# Patient Record
Sex: Female | Born: 1995 | Race: Black or African American | Hispanic: No | Marital: Single | Smoking: Never smoker
Health system: Southern US, Community
[De-identification: ages and names within clinical notes are randomized; demographics above are authoritative.]

## PROBLEM LIST (undated history)

## (undated) DIAGNOSIS — J45909 Unspecified asthma, uncomplicated: Secondary | ICD-10-CM

---
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
## 2017-11-03 ENCOUNTER — Emergency Department (HOSPITAL_COMMUNITY)
Admission: EM | Admit: 2017-11-03 | Discharge: 2017-11-04 | Disposition: A | Discharge status: Home / Self Care | Payer: Medicaid Other | Attending: Emergency Medicine | Admitting: Emergency Medicine

## 2017-11-03 ENCOUNTER — Encounter (HOSPITAL_COMMUNITY): Payer: Self-pay | Admitting: Emergency Medicine

## 2017-11-03 ENCOUNTER — Other Ambulatory Visit: Payer: Self-pay

## 2017-11-03 DIAGNOSIS — S61211A Laceration without foreign body of left index finger without damage to nail, initial encounter: Secondary | ICD-10-CM | POA: Insufficient documentation

## 2017-11-03 DIAGNOSIS — W260XXA Contact with knife, initial encounter: Secondary | ICD-10-CM | POA: Diagnosis not present

## 2017-11-03 DIAGNOSIS — Z23 Encounter for immunization: Secondary | ICD-10-CM | POA: Diagnosis not present

## 2017-11-03 DIAGNOSIS — Y93G1 Activity, food preparation and clean up: Secondary | ICD-10-CM | POA: Diagnosis not present

## 2017-11-03 DIAGNOSIS — Y92000 Kitchen of unspecified non-institutional (private) residence as  the place of occurrence of the external cause: Secondary | ICD-10-CM | POA: Insufficient documentation

## 2017-11-03 DIAGNOSIS — S6992XA Unspecified injury of left wrist, hand and finger(s), initial encounter: Secondary | ICD-10-CM | POA: Diagnosis present

## 2017-11-03 DIAGNOSIS — Y998 Other external cause status: Secondary | ICD-10-CM | POA: Insufficient documentation

## 2017-11-03 DIAGNOSIS — J45909 Unspecified asthma, uncomplicated: Secondary | ICD-10-CM | POA: Insufficient documentation

## 2017-11-03 LAB — POC URINE PREG, ED: Preg Test, Ur: NEGATIVE

## 2017-11-03 MED ORDER — IBUPROFEN 400 MG PO TABS
600.0000 mg | ORAL_TABLET | Freq: Once | ORAL | Status: AC
  Administered 2017-11-03: 600 mg via ORAL
  Filled 2017-11-03: qty 1

## 2017-11-03 MED ORDER — BUPIVACAINE HCL 0.25 % IJ SOLN
10.0000 mL | Freq: Once | INTRAMUSCULAR | Status: DC
  Filled 2017-11-03: qty 10

## 2017-11-03 MED ORDER — BUPIVACAINE HCL (PF) 0.25 % IJ SOLN
10.0000 mL | Freq: Once | INTRAMUSCULAR | Status: AC
  Administered 2017-11-03: 10 mL
  Filled 2017-11-03: qty 30

## 2017-11-03 MED ORDER — TETANUS-DIPHTH-ACELL PERTUSSIS 5-2.5-18.5 LF-MCG/0.5 IM SUSP
0.5000 mL | Freq: Once | INTRAMUSCULAR | Status: AC
  Administered 2017-11-03: 0.5 mL via INTRAMUSCULAR
  Filled 2017-11-03: qty 0.5

## 2017-11-03 NOTE — ED Triage Notes (Signed)
Pt states that she got a small laceration on her index finger left hand while cutting some sausage. Pt has a band aid applied by her self at home bleeding controlled. Pt will like to know if she is pregnant no having a period for 3 months.

## 2017-11-03 NOTE — ED Provider Notes (Signed)
[facility] EM[facility]ENC[facility] Provider Note   CSN: [id number] Arrival date & time: 11/03/17  2017     History   Chief Complaint Chief Complaint  Patient presents with  . Finger Injury    HPI Annette Lynch is a 21 y.o. female.  HPI   Patient is a 21 year old female with a history of asthma presenting for a laceration to the index finger of the left hand prior to arrival.  Patient reports that she was preparing sausages and reaching into a knife block when she cut the extensor surface of the left first finger.  Patient reports that she has decreased sensation but no loss of sensation.  Bandage immediately applied.  Tetanus not up-to-date.  Past Medical History:  Diagnosis Date  . Asthma     There are no active problems to display for this patient.   History reviewed. No pertinent surgical history.  OB History    No data available       Home Medications    Prior to Admission medications   Medication Sig Start Date End Date Taking? Authorizing Provider  cephALEXin (KEFLEX) 500 MG capsule Take 1 capsule (500 mg total) by mouth 4 (four) times daily. 11/04/17   Elisha PonderMurray, Alyssa B, PA-C    Family History No family history on file.  Social History Social History   Tobacco Use  . Smoking status: Never Smoker  Substance Use Topics  . Alcohol use: No    Frequency: Never  . Drug use: No     Allergies   Patient has no known allergies.   Review of Systems Review of Systems  Skin: Positive for wound.  Neurological: Negative for weakness and numbness.     Physical Exam Updated Vital Signs BP (!) 145/56 (BP Location: Right Arm)   Pulse 84   Temp 98.3 F (36.8 C) (Oral)   Resp 16   Ht 5\' 8"  (1.727 m)   Wt 106.6 kg (235 lb)   LMP 08/22/2017   SpO2 100%   BMI 35.73 kg/m   Physical Exam  Constitutional: She appears well-developed and well-nourished. No distress.  Sitting comfortably in bed.  HENT:  Head: Normocephalic and  atraumatic.  Eyes: Conjunctivae are normal. Right eye exhibits no discharge. Left eye exhibits no discharge.  EOMs normal to gross examination.  Neck: Normal range of motion.  Cardiovascular: Normal rate and regular rhythm.  Intact, 2+ radial and ulnar pulse of the left hand.  Pulmonary/Chest:  Normal respiratory effort. Patient converses comfortably. No audible wheeze or stridor.  Abdominal: She exhibits no distension.  Musculoskeletal: Normal range of motion.  Left Hand Exam: Hand Exam:  Inspection: There is approximately a 1-1.5 cm laceration extending to the dermis on the extensor surface of the left index finger.   Palpation: No tenderness to palpation of MCP, PIP, or DIP of the affected left index finger. ROM: Passive/active ROM intact at wrist, MCP, PIP, and DIP joints, thumb MCP and IP joints, and no rotational deformity of metacarpals noted. Ligamentous stability: No laxity to valgus/varus stress of MCP, PIP, or DIP joints. No joint laxity with radial stress of thumb. Flexor/Extensor tendons: FDS/FDP tendons intact in digits 2-5 at PIP/DIP joints, respectively; extensor tendons intact in all digits Nerve testing:  Sensation is intact to the distal tip and surrounding laceration of the left index finger.  Neurological: She is alert.  Cranial nerves intact to gross observation. Patient moves extremities without difficulty.  Skin: Skin is warm and dry. She is  not diaphoretic.  Psychiatric: She has a normal mood and affect. Her behavior is normal. Judgment and thought content normal.  Nursing note and vitals reviewed.    ED Treatments / Results  Labs (all labs ordered are listed, but only abnormal results are displayed) Labs Reviewed  POC URINE PREG, ED    EKG  EKG Interpretation None       Radiology No results found.  Procedures .Nerve Block Date/Time: 11/04/2017 3:45 AM Performed by: Elisha Ponder, PA-C Authorized by: Elisha Ponder, PA-C   Consent:     Consent obtained:  Verbal   Consent given by:  Patient   Risks discussed:  Unsuccessful block Indications:    Indications:  Procedural anesthesia Location:    Body area:  Upper extremity (Left second digit)   Laterality:  Left Pre-procedure details:    Skin preparation:  2% chlorhexidine Skin anesthesia (see MAR for exact dosages):    Skin anesthesia method:  Local infiltration   Local anesthetic:  Bupivacaine 0.25% w/o epi Procedure details (see MAR for exact dosages):    Block needle gauge:  25 G   Anesthetic injected:  Bupivacaine 0.25% w/o epi   Injection procedure:  Anatomic landmarks identified and incremental injection   Paresthesia:  None Post-procedure details:    Outcome:  Anesthesia achieved   Patient tolerance of procedure:  Tolerated well, no immediate complications .Marland KitchenLaceration Repair Date/Time: 11/04/2017 3:47 AM Performed by: Elisha Ponder, PA-C Authorized by: Elisha Ponder, PA-C   Consent:    Consent obtained:  Verbal   Consent given by:  Patient   Risks discussed:  Infection and pain Anesthesia (see MAR for exact dosages):    Anesthesia method:  Local infiltration   Local anesthetic:  Bupivacaine 0.25% w/o epi Laceration details:    Location:  Finger   Finger location:  L index finger   Length (cm):  1.5 Repair type:    Repair type:  Simple Pre-procedure details:    Preparation:  Patient was prepped and draped in usual sterile fashion Exploration:    Hemostasis achieved with:  Direct pressure   Wound exploration: wound explored through full range of motion     Contaminated: no   Treatment:    Area cleansed with:  Betadine   Amount of cleaning:  Standard   Irrigation solution:  Sterile saline   Irrigation volume:  1000 ml   Irrigation method:  Syringe Skin repair:    Repair method:  Sutures   Suture size:  4-0   Suture material:  Prolene   Suture technique:  Simple interrupted   Number of sutures:  3 Approximation:    Approximation:   Close Post-procedure details:    Dressing:  Non-adherent dressing and splint for protection   Patient tolerance of procedure:  Tolerated well, no immediate complications   (including critical care time)  Medications Ordered in ED Medications  Tdap (BOOSTRIX) injection 0.5 mL (0.5 mLs Intramuscular Given 11/03/17 2235)  ibuprofen (ADVIL,MOTRIN) tablet 600 mg (600 mg Oral Given 11/03/17 2235)  bupivacaine (PF) (MARCAINE) 0.25 % injection 10 mL (10 mLs Infiltration Given 11/03/17 2314)     Initial Impression / Assessment and Plan / ED Course  I have reviewed the triage vital signs and the nursing notes.  Pertinent labs & imaging results that were available during my care of the patient were reviewed by me and considered in my medical decision making (see chart for details).     Final Clinical Impressions(s) / ED Diagnoses  Final diagnoses:  Laceration of left index finger without foreign body without damage to nail, initial encounter   Patient is well-appearing and in no acute distress.  Mechanism of the laceration with a knife is not concerning for foreign body warranting imaging at this time.  Patient is neurovascularly intact in the left index finger with no evidence of tendon compromise.  Primary closure is warranted.  3 Prolene sutures placed today.  Patient tolerated procedure well.  I discussed post procedure care with patient and instructed to follow-up in 7 days for suture removal.  Due to that this injury occurred with raw meat exposure from knife, will treat with antibiotic prophylaxis.  Return precautions given for any spreading erythema, drainage, worsening pain or swelling.  Patient is in understanding and agrees with the plan of care.   Tetanus updated today. ED Discharge Orders        Ordered    cephALEXin (KEFLEX) 500 MG capsule  4 times daily     11/04/17 0109       Delia ChimesMurray, Alyssa B, PA-C 11/04/17 0351    Benjiman CorePickering, Nathan, MD 11/06/17 0028

## 2017-11-04 MED ORDER — CEPHALEXIN 500 MG PO CAPS: 500.0000 mg | ORAL_CAPSULE | Freq: Four times a day (QID) | ORAL | 0 refills | Status: DC

## 2017-11-04 NOTE — Discharge Instructions (Signed)
Please see the information and instructions below regarding your visit.  Your diagnoses today include:  1. Laceration of left index finger without foreign body without damage to nail, initial encounter      Tests performed today include: Vital signs. See below for your results today.   Medications prescribed:   Take any prescribed medications only as directed.  Ibuprofen alternating with Tylenol for pain.   Please take all of your antibiotics until finished.   You may develop abdominal discomfort or nausea from the antibiotic. If this occurs, you may take it with food. Some patients also get diarrhea with antibiotics. You may help offset this with probiotics which you can buy or get in yogurt. Do not eat or take the probiotics until 2 hours after your antibiotic. Some women develop vaginal yeast infections after antibiotics. If you develop unusual vaginal discharge after being on this medication, please see your primary care provider.   Some people develop allergies to antibiotics. Symptoms of antibiotic allergy can be mild and include a flat rash and itching. They can also be more serious and include:  ?Hives - Hives are raised, red patches of skin that are usually very itchy.  ?Lip or tongue swelling  ?Trouble swallowing or breathing  ?Blistering of the skin or mouth.  If you have any of these serious symptoms, please seek emergency medical care immediately.   Home care instructions:  Follow any educational materials and wound care instructions contained in this packet.   Keep affected area above the level of your heart when possible to minimize swelling. Wash area gently twice a day with warm soapy water. Do not apply alcohol or hydrogen peroxide directly over a wound. Cover the area if it is draining or weeping. Keep the bandage in place for 24 hours and refrain from getting the wound wet for 24 hours. After that, you may get the area wet, but please ensure that you dry it  completely afterwards.  Please refrain from soaking sutures for long periods of time, or swimming in chlorinated water   You may apply antibiotic ointment such as Bacitracin or Neosporin.  Follow-up instructions: Suture Removal: Return to the Emergency Department or see your primary care care doctor in 7 days for a recheck of your wound and removal of your sutures or staples.    Return instructions:  Return to the Emergency Department if you have: Fever Worsening pain Worsening swelling of the wound Pus draining from the wound Redness of the skin that moves away from the wound, especially if it streaks away from the affected area  Any other emergent concerns  Your vital signs today were: BP (!) 150/66 (BP Location: Right Arm)    Pulse 97    Temp 98.3 F (36.8 C) (Oral)    Resp 18    Ht 5\' 8"  (1.727 m)    Wt 106.6 kg (235 lb)    LMP 08/22/2017    SpO2 100%    BMI 35.73 kg/m  If your blood pressure (BP) was elevated on multiple readings during this visit above 130 for the top number or above 80 for the bottom number, please have this repeated by your primary care provider within one month.   In regards to not having a period, please follow-up with your primary doctor about the workup of this.  I listed some primary doctors below that you may call to establish care.  To find a primary care or specialty doctor please call [phone] or [phone] to  access "[facility] Find a Doctor Service."  You may also go on the [facility] website at [email or web address]  There are also multiple [facility] practices throughout the [place] that are frequently accepting new patients. You may find a clinic that is close to your home and contact them.  [facility] - [address] [phone]  [facility] in [place] (also locations in [place] and [place]) - [address] [place]  [phone]  [facility] - [address] [phone]   --------------  Thank you for allowing us to participate in your care today! It was a pleasure taking care of you.

## 2017-11-21 ENCOUNTER — Ambulatory Visit (HOSPITAL_COMMUNITY)
Admission: EM | Admit: 2017-11-21 | Discharge: 2017-11-21 | Disposition: A | Discharge status: Home / Self Care | Payer: Medicaid Other

## 2017-11-21 DIAGNOSIS — Z4802 Encounter for removal of sutures: Secondary | ICD-10-CM

## 2017-11-21 NOTE — ED Triage Notes (Signed)
Pt here for suture removal. Has three sutures in her L pointer finger. Laceration is well healed and approximated. No signs of infection. Three sutures removed. Area is clean and dry. Denies pain.  Pt given return instructions, verbalized understanding. Ambulated outside of UC.

## 2018-01-17 ENCOUNTER — Ambulatory Visit: Payer: Medicaid Other | Admitting: Student in an Organized Health Care Education/Training Program

## 2023-09-25 ENCOUNTER — Inpatient Hospital Stay (HOSPITAL_COMMUNITY)
Admission: EM | Admit: 2023-09-25 | Discharge: 2023-09-28 | DRG: 638 | Disposition: A | Discharge status: Home Health | Payer: Medicaid Other | Attending: Internal Medicine | Admitting: Internal Medicine

## 2023-09-25 ENCOUNTER — Encounter (HOSPITAL_COMMUNITY): Payer: Self-pay | Admitting: Emergency Medicine

## 2023-09-25 ENCOUNTER — Other Ambulatory Visit: Payer: Self-pay

## 2023-09-25 DIAGNOSIS — Z888 Allergy status to other drugs, medicaments and biological substances status: Secondary | ICD-10-CM

## 2023-09-25 DIAGNOSIS — E66813 Obesity, class 3: Secondary | ICD-10-CM | POA: Insufficient documentation

## 2023-09-25 DIAGNOSIS — R739 Hyperglycemia, unspecified: Secondary | ICD-10-CM | POA: Diagnosis present

## 2023-09-25 DIAGNOSIS — E1101 Type 2 diabetes mellitus with hyperosmolarity with coma: Principal | ICD-10-CM | POA: Diagnosis present

## 2023-09-25 DIAGNOSIS — E282 Polycystic ovarian syndrome: Secondary | ICD-10-CM

## 2023-09-25 DIAGNOSIS — Z7984 Long term (current) use of oral hypoglycemic drugs: Secondary | ICD-10-CM

## 2023-09-25 DIAGNOSIS — E1110 Type 2 diabetes mellitus with ketoacidosis without coma: Secondary | ICD-10-CM

## 2023-09-25 DIAGNOSIS — E1100 Type 2 diabetes mellitus with hyperosmolarity without nonketotic hyperglycemic-hyperosmolar coma (NKHHC): Secondary | ICD-10-CM | POA: Diagnosis present

## 2023-09-25 DIAGNOSIS — Z6841 Body Mass Index (BMI) 40.0 and over, adult: Secondary | ICD-10-CM

## 2023-09-25 DIAGNOSIS — E1111 Type 2 diabetes mellitus with ketoacidosis with coma: Secondary | ICD-10-CM | POA: Diagnosis present

## 2023-09-25 DIAGNOSIS — E1310 Other specified diabetes mellitus with ketoacidosis without coma: Principal | ICD-10-CM

## 2023-09-25 LAB — URINALYSIS, ROUTINE W REFLEX MICROSCOPIC
Bacteria, UA: NONE SEEN
Bilirubin Urine: NEGATIVE
Glucose, UA: >=500 mg/dL — AB
Hgb urine dipstick: NEGATIVE
Ketones, ur: 20 mg/dL — AB
Leukocytes,Ua: NEGATIVE
Nitrite: NEGATIVE
Protein, ur: NEGATIVE mg/dL
Specific Gravity, Urine: 1.032 — ABNORMAL HIGH (ref 1.005–1.030)
pH: 5 (ref 5.0–8.0)

## 2023-09-25 LAB — CBC WITH DIFFERENTIAL/PLATELET
Abs Immature Granulocytes: 0.02 10*3/uL (ref 0.00–0.07)
Basophils Absolute: 0 10*3/uL (ref 0.0–0.1)
Basophils Relative: 0 %
Eosinophils Absolute: 0.1 10*3/uL (ref 0.0–0.5)
Eosinophils Relative: 1 %
HCT: 47.2 % — ABNORMAL HIGH (ref 36.0–46.0)
Hemoglobin: 14.3 g/dL (ref 12.0–15.0)
Immature Granulocytes: 0 %
Lymphocytes Relative: 28 %
Lymphs Abs: 2 10*3/uL (ref 0.7–4.0)
MCH: 23.7 pg — ABNORMAL LOW (ref 26.0–34.0)
MCHC: 30.3 g/dL (ref 30.0–36.0)
MCV: 78.1 fL — ABNORMAL LOW (ref 80.0–100.0)
Monocytes Absolute: 0.6 10*3/uL (ref 0.1–1.0)
Monocytes Relative: 8 %
Neutro Abs: 4.6 10*3/uL (ref 1.7–7.7)
Neutrophils Relative %: 63 %
Platelets: 320 10*3/uL (ref 150–400)
RBC: 6.04 MIL/uL — ABNORMAL HIGH (ref 3.87–5.11)
RDW: 14.6 % (ref 11.5–15.5)
WBC: 7.3 10*3/uL (ref 4.0–10.5)
nRBC: 0 % (ref 0.0–0.2)

## 2023-09-25 LAB — BETA-HYDROXYBUTYRIC ACID: Beta-Hydroxybutyric Acid: 2.99 mmol/L — ABNORMAL HIGH (ref 0.05–0.27)

## 2023-09-25 LAB — BASIC METABOLIC PANEL
Anion gap: 15 (ref 5–15)
BUN: 14 mg/dL (ref 6–20)
CO2: 21 mmol/L — ABNORMAL LOW (ref 22–32)
Calcium: 9.4 mg/dL (ref 8.9–10.3)
Chloride: 93 mmol/L — ABNORMAL LOW (ref 98–111)
Creatinine, Ser: 0.94 mg/dL (ref 0.44–1.00)
GFR, Estimated: >60 mL/min (ref >60)
Glucose, Bld: 566 mg/dL (ref 70–99)
Potassium: 4.2 mmol/L (ref 3.5–5.1)
Sodium: 129 mmol/L — ABNORMAL LOW (ref 135–145)

## 2023-09-25 LAB — HCG, SERUM, QUALITATIVE: Preg, Serum: NEGATIVE

## 2023-09-25 LAB — CBG MONITORING, ED: Glucose-Capillary: 569 mg/dL (ref 70–99)

## 2023-09-25 NOTE — ED Triage Notes (Signed)
Pt presents for CBG >600 at home along with excessive urination, N/V,  and thirst. Takes metformin typically.

## 2023-09-25 NOTE — ED Provider Triage Note (Signed)
Emergency Medicine Provider Triage Evaluation Note  Annette Lynch , a 27 y.o. female  was evaluated in triage.  Pt complains of hyperglycemia,Oncology metformin. Increased thirst, dry mouth. Blood sugar read "high". No hx of same.  Patient denies sxs of infection  Review of Systems  Positive: Dry mouth Negative: fever  Physical Exam  BP (!) 152/94 (BP Location: Right Arm)   Pulse (!) 105   Temp 97.8 F (36.6 C) (Oral)   Resp (!) 22   Wt 124.7 kg   SpO2 94%   BMI 41.81 kg/m  Gen:   Awake, no distress   Resp:  Normal effort  MSK:   Moves extremities without difficulty  Other:  No kussmaul  Medical Decision Making  Medically screening exam initiated at 9:24 PM.  Appropriate orders placed.  Annette Lynch was informed that the remainder of the evaluation will be completed by another provider, this initial triage assessment does not replace that evaluation, and the importance of remaining in the ED until their evaluation is complete.     Arthor Captain, PA-C 09/25/23 2126

## 2023-09-26 ENCOUNTER — Other Ambulatory Visit (HOSPITAL_COMMUNITY): Payer: Self-pay

## 2023-09-26 ENCOUNTER — Telehealth (HOSPITAL_COMMUNITY): Payer: Self-pay | Admitting: Pharmacy Technician

## 2023-09-26 DIAGNOSIS — E1111 Type 2 diabetes mellitus with ketoacidosis with coma: Secondary | ICD-10-CM | POA: Diagnosis present

## 2023-09-26 DIAGNOSIS — Z794 Long term (current) use of insulin: Secondary | ICD-10-CM

## 2023-09-26 DIAGNOSIS — E1100 Type 2 diabetes mellitus with hyperosmolarity without nonketotic hyperglycemic-hyperosmolar coma (NKHHC): Secondary | ICD-10-CM | POA: Diagnosis present

## 2023-09-26 DIAGNOSIS — Z6841 Body Mass Index (BMI) 40.0 and over, adult: Secondary | ICD-10-CM | POA: Diagnosis not present

## 2023-09-26 DIAGNOSIS — E1165 Type 2 diabetes mellitus with hyperglycemia: Secondary | ICD-10-CM | POA: Diagnosis not present

## 2023-09-26 DIAGNOSIS — Z888 Allergy status to other drugs, medicaments and biological substances status: Secondary | ICD-10-CM | POA: Diagnosis not present

## 2023-09-26 DIAGNOSIS — Z7984 Long term (current) use of oral hypoglycemic drugs: Secondary | ICD-10-CM | POA: Diagnosis not present

## 2023-09-26 DIAGNOSIS — E1101 Type 2 diabetes mellitus with hyperosmolarity with coma: Secondary | ICD-10-CM | POA: Diagnosis present

## 2023-09-26 DIAGNOSIS — E1310 Other specified diabetes mellitus with ketoacidosis without coma: Secondary | ICD-10-CM | POA: Diagnosis present

## 2023-09-26 DIAGNOSIS — E282 Polycystic ovarian syndrome: Secondary | ICD-10-CM | POA: Diagnosis present

## 2023-09-26 DIAGNOSIS — E1110 Type 2 diabetes mellitus with ketoacidosis without coma: Secondary | ICD-10-CM

## 2023-09-26 DIAGNOSIS — R739 Hyperglycemia, unspecified: Secondary | ICD-10-CM | POA: Diagnosis present

## 2023-09-26 LAB — GLUCOSE, CAPILLARY
Glucose-Capillary: 212 mg/dL — ABNORMAL HIGH (ref 70–99)
Glucose-Capillary: 241 mg/dL — ABNORMAL HIGH (ref 70–99)
Glucose-Capillary: 417 mg/dL — ABNORMAL HIGH (ref 70–99)
Glucose-Capillary: 336 mg/dL — ABNORMAL HIGH (ref 70–99)
Glucose-Capillary: 357 mg/dL — ABNORMAL HIGH (ref 70–99)
Glucose-Capillary: 326 mg/dL — ABNORMAL HIGH (ref 70–99)
Glucose-Capillary: 462 mg/dL — ABNORMAL HIGH (ref 70–99)

## 2023-09-26 LAB — BLOOD GAS, VENOUS
Acid-base deficit: 2.5 mmol/L — ABNORMAL HIGH (ref 0.0–2.0)
Bicarbonate: 21.7 mmol/L (ref 20.0–28.0)
O2 Saturation: 97.4 %
Patient temperature: 36.1
pCO2, Ven: 34 mm[Hg] — ABNORMAL LOW (ref 44–60)
pH, Ven: 7.41 (ref 7.25–7.43)
pO2, Ven: 69 mm[Hg] — ABNORMAL HIGH (ref 32–45)

## 2023-09-26 LAB — BASIC METABOLIC PANEL WITH GFR
Anion gap: 9 (ref 5–15)
BUN: 11 mg/dL (ref 6–20)
CO2: 23 mmol/L (ref 22–32)
Calcium: 9 mg/dL (ref 8.9–10.3)
Chloride: 102 mmol/L (ref 98–111)
Creatinine, Ser: 0.59 mg/dL (ref 0.44–1.00)
GFR, Estimated: >60 mL/min
Glucose, Bld: 281 mg/dL — ABNORMAL HIGH (ref 70–99)
Potassium: 3.2 mmol/L — ABNORMAL LOW (ref 3.5–5.1)
Sodium: 134 mmol/L — ABNORMAL LOW (ref 135–145)

## 2023-09-26 LAB — CBG MONITORING, ED
Glucose-Capillary: 261 mg/dL — ABNORMAL HIGH (ref 70–99)
Glucose-Capillary: 286 mg/dL — ABNORMAL HIGH (ref 70–99)
Glucose-Capillary: 347 mg/dL — ABNORMAL HIGH (ref 70–99)
Glucose-Capillary: 505 mg/dL (ref 70–99)

## 2023-09-26 LAB — CBC WITH DIFFERENTIAL/PLATELET
Abs Immature Granulocytes: 0.03 10*3/uL (ref 0.00–0.07)
Basophils Absolute: 0 10*3/uL (ref 0.0–0.1)
Basophils Relative: 0 %
Eosinophils Absolute: 0.1 10*3/uL (ref 0.0–0.5)
Eosinophils Relative: 1 %
HCT: 42.7 % (ref 36.0–46.0)
Hemoglobin: 13.2 g/dL (ref 12.0–15.0)
Immature Granulocytes: 0 %
Lymphocytes Relative: 36 %
Lymphs Abs: 2.6 10*3/uL (ref 0.7–4.0)
MCH: 24.1 pg — ABNORMAL LOW (ref 26.0–34.0)
MCHC: 30.9 g/dL (ref 30.0–36.0)
MCV: 77.9 fL — ABNORMAL LOW (ref 80.0–100.0)
Monocytes Absolute: 0.6 10*3/uL (ref 0.1–1.0)
Monocytes Relative: 8 %
Neutro Abs: 3.9 10*3/uL (ref 1.7–7.7)
Neutrophils Relative %: 55 %
Platelets: 297 10*3/uL (ref 150–400)
RBC: 5.48 MIL/uL — ABNORMAL HIGH (ref 3.87–5.11)
RDW: 14.2 % (ref 11.5–15.5)
WBC: 7.2 10*3/uL (ref 4.0–10.5)
nRBC: 0 % (ref 0.0–0.2)

## 2023-09-26 LAB — HEMOGLOBIN A1C
Hgb A1c MFr Bld: 10.1 % — ABNORMAL HIGH (ref 4.8–5.6)
Mean Plasma Glucose: 243.17 mg/dL

## 2023-09-26 LAB — MRSA NEXT GEN BY PCR, NASAL: MRSA by PCR Next Gen: DETECTED — AB

## 2023-09-26 MED ORDER — DEXTROSE 50 % IV SOLN: 0.0000 mL | INTRAVENOUS | Status: DC | PRN

## 2023-09-26 MED ORDER — INSULIN ASPART 100 UNIT/ML IJ SOLN
5.0000 [IU] | Freq: Three times a day (TID) | INTRAMUSCULAR | Status: DC
  Administered 2023-09-26: 5 [IU] via SUBCUTANEOUS

## 2023-09-26 MED ORDER — INSULIN ASPART 100 UNIT/ML IJ SOLN
0.0000 [IU] | Freq: Three times a day (TID) | INTRAMUSCULAR | Status: DC
  Administered 2023-09-26: 15 [IU] via SUBCUTANEOUS
  Administered 2023-09-26: 5 [IU] via SUBCUTANEOUS
  Administered 2023-09-26: 11 [IU] via SUBCUTANEOUS
  Administered 2023-09-27: 15 [IU] via SUBCUTANEOUS
  Filled 2023-09-26: qty 0.15

## 2023-09-26 MED ORDER — INSULIN GLARGINE-YFGN 100 UNIT/ML ~~LOC~~ SOLN
25.0000 [IU] | Freq: Every day | SUBCUTANEOUS | Status: DC
  Administered 2023-09-27: 25 [IU] via SUBCUTANEOUS
  Filled 2023-09-26: qty 0.25

## 2023-09-26 MED ORDER — INSULIN ASPART 100 UNIT/ML IJ SOLN
0.0000 [IU] | Freq: Every day | INTRAMUSCULAR | Status: DC
  Filled 2023-09-26: qty 0.05

## 2023-09-26 MED ORDER — ONDANSETRON HCL 4 MG PO TABS: 4.0000 mg | ORAL_TABLET | Freq: Four times a day (QID) | ORAL | Status: DC | PRN

## 2023-09-26 MED ORDER — INSULIN ASPART 100 UNIT/ML IJ SOLN
11.0000 [IU] | Freq: Once | INTRAMUSCULAR | Status: AC
  Administered 2023-09-26: 11 [IU] via SUBCUTANEOUS

## 2023-09-26 MED ORDER — ACETAMINOPHEN 325 MG PO TABS: 650.0000 mg | ORAL_TABLET | Freq: Four times a day (QID) | ORAL | Status: DC | PRN

## 2023-09-26 MED ORDER — ENOXAPARIN SODIUM 40 MG/0.4ML IJ SOSY
40.0000 mg | PREFILLED_SYRINGE | INTRAMUSCULAR | Status: DC
Start: 2023-09-26 — End: 2023-09-28
  Administered 2023-09-26 – 2023-09-27 (×2): 40 mg via SUBCUTANEOUS
  Filled 2023-09-26 (×3): qty 0.4

## 2023-09-26 MED ORDER — INSULIN ASPART 100 UNIT/ML IJ SOLN
6.0000 [IU] | Freq: Three times a day (TID) | INTRAMUSCULAR | Status: DC
  Administered 2023-09-27: 6 [IU] via SUBCUTANEOUS

## 2023-09-26 MED ORDER — INSULIN ASPART 100 UNIT/ML IJ SOLN
7.0000 [IU] | Freq: Once | INTRAMUSCULAR | Status: AC
  Administered 2023-09-26: 7 [IU] via SUBCUTANEOUS

## 2023-09-26 MED ORDER — POTASSIUM CHLORIDE CRYS ER 20 MEQ PO TBCR
60.0000 meq | EXTENDED_RELEASE_TABLET | Freq: Once | ORAL | Status: AC
  Administered 2023-09-26: 60 meq via ORAL
  Filled 2023-09-26: qty 3

## 2023-09-26 MED ORDER — DEXTROSE-SODIUM CHLORIDE 5-0.45 % IV SOLN: INTRAVENOUS | Status: AC

## 2023-09-26 MED ORDER — SENNOSIDES-DOCUSATE SODIUM 8.6-50 MG PO TABS: 1.0000 | ORAL_TABLET | Freq: Every evening | ORAL | Status: DC | PRN

## 2023-09-26 MED ORDER — INSULIN REGULAR(HUMAN) IN NACL 100-0.9 UT/100ML-% IV SOLN
INTRAVENOUS | Status: DC
  Administered 2023-09-26: 13 [IU]/h via INTRAVENOUS
  Filled 2023-09-26: qty 100

## 2023-09-26 MED ORDER — ONDANSETRON HCL 4 MG/2ML IJ SOLN: 4.0000 mg | Freq: Four times a day (QID) | INTRAMUSCULAR | Status: DC | PRN

## 2023-09-26 MED ORDER — INSULIN GLARGINE-YFGN 100 UNIT/ML ~~LOC~~ SOLN
18.0000 [IU] | SUBCUTANEOUS | Status: DC
  Administered 2023-09-26: 18 [IU] via SUBCUTANEOUS
  Filled 2023-09-26 (×2): qty 0.18

## 2023-09-26 MED ORDER — LIVING WELL WITH DIABETES BOOK
Freq: Once | Status: AC
  Filled 2023-09-26: qty 1

## 2023-09-26 MED ORDER — LACTATED RINGERS IV BOLUS
20.0000 mL/kg | Freq: Once | INTRAVENOUS | Status: AC
  Administered 2023-09-26: 2494 mL via INTRAVENOUS

## 2023-09-26 MED ORDER — CHLORHEXIDINE GLUCONATE CLOTH 2 % EX PADS
6.0000 | MEDICATED_PAD | Freq: Every day | CUTANEOUS | Status: DC
  Administered 2023-09-27: 6 via TOPICAL

## 2023-09-26 MED ORDER — INSULIN GLARGINE-YFGN 100 UNIT/ML ~~LOC~~ SOLN
7.0000 [IU] | SUBCUTANEOUS | Status: AC
  Administered 2023-09-26: 7 [IU] via SUBCUTANEOUS
  Filled 2023-09-26: qty 0.07

## 2023-09-26 MED ORDER — ACETAMINOPHEN 650 MG RE SUPP: 650.0000 mg | Freq: Four times a day (QID) | RECTAL | Status: DC | PRN

## 2023-09-26 NOTE — ED Provider Notes (Addendum)
[place] EMERGENCY DEPARTMENT AT [facility] Provider Note   CSN: [id number] Arrival date & time: 09/25/23  2041     History  Chief Complaint  Patient presents with   Hyperglycemia    Annette Lynch is a 27 y.o. female.  The history is provided by the patient and a relative.  Hyperglycemia Severity:  Severe Onset quality:  Gradual Duration: days. Timing:  Constant Progression:  Worsening Diabetes status:  Controlled with oral medications Current diabetic therapy:  Metformin but has missed doses, does not have a glucometer and has never seen a diabetic nutritionist Context: not change in medication   Relieved by:  Nothing Ineffective treatments:  None tried Associated symptoms: increased thirst and polyuria   Associated symptoms: no fever, no vomiting and no weakness   Risk factors: no pancreatic disease       Past Medical History:  Diagnosis Date   Asthma      Home Medications Prior to Admission medications   Medication Sig Start Date End Date Taking? Authorizing Provider  cephALEXin (KEFLEX) 500 MG capsule Take 1 capsule (500 mg total) by mouth 4 (four) times daily. 11/04/17   Aviva Kluver B, PA-C      Allergies    Diphenhydramine and Motrin [ibuprofen]    Review of Systems   Review of Systems  Constitutional:  Negative for fever.  HENT:  Negative for facial swelling.   Eyes:  Negative for redness.  Respiratory:  Negative for wheezing and stridor.   Gastrointestinal:  Negative for vomiting.  Endocrine: Positive for polydipsia and polyuria.  Neurological:  Negative for weakness.  All other systems reviewed and are negative.   Physical Exam Updated Vital Signs BP (!) 144/93 (BP Location: Left Arm)   Pulse (!) 109   Temp 98 F (36.7 C) (Oral)   Resp 18   Wt 124.7 kg   SpO2 98%   BMI 41.81 kg/m  Physical Exam Vitals and nursing note reviewed.  Constitutional:      General: She is not in acute distress.    Appearance: Normal  appearance. She is well-developed.  HENT:     Head: Normocephalic and atraumatic.     Nose: Nose normal.  Eyes:     Pupils: Pupils are equal, round, and reactive to light.  Cardiovascular:     Rate and Rhythm: Normal rate and regular rhythm.     Pulses: Normal pulses.     Heart sounds: Normal heart sounds.  Pulmonary:     Effort: Pulmonary effort is normal. No respiratory distress.     Breath sounds: Normal breath sounds.  Abdominal:     General: Bowel sounds are normal. There is no distension.     Palpations: Abdomen is soft.     Tenderness: There is no abdominal tenderness. There is no guarding or rebound.  Musculoskeletal:        General: Normal range of motion.     Cervical back: Neck supple.  Skin:    General: Skin is warm and dry.     Capillary Refill: Capillary refill takes less than 2 seconds.     Findings: No erythema or rash.  Neurological:     General: No focal deficit present.     Mental Status: She is alert and oriented to person, place, and time.     Deep Tendon Reflexes: Reflexes normal.  Psychiatric:        Mood and Affect: Mood normal.     ED Results / Procedures / Treatments  Labs (all labs ordered are listed, but only abnormal results are displayed) Results for orders placed or performed during the hospital encounter of 09/25/23  hCG, serum, qualitative  Result Value Ref Range   Preg, Serum NEGATIVE NEGATIVE  Urinalysis, Routine w reflex microscopic -Urine, Clean Catch  Result Value Ref Range   Color, Urine COLORLESS (A) YELLOW   APPearance CLEAR CLEAR   Specific Gravity, Urine 1.032 (H) 1.005 - 1.030   pH 5.0 5.0 - 8.0   Glucose, UA >=500 (A) NEGATIVE mg/dL   Hgb urine dipstick NEGATIVE NEGATIVE   Bilirubin Urine NEGATIVE NEGATIVE   Ketones, ur 20 (A) NEGATIVE mg/dL   Protein, ur NEGATIVE NEGATIVE mg/dL   Nitrite NEGATIVE NEGATIVE   Leukocytes,Ua NEGATIVE NEGATIVE   RBC / HPF 0-5 0 - 5 RBC/hpf   WBC, UA 0-5 0 - 5 WBC/hpf   Bacteria, UA NONE  SEEN NONE SEEN   Squamous Epithelial / HPF 0-5 0 - 5 /HPF  Basic metabolic panel  Result Value Ref Range   Sodium 129 (L) 135 - 145 mmol/L   Potassium 4.2 3.5 - 5.1 mmol/L   Chloride 93 (L) 98 - 111 mmol/L   CO2 21 (L) 22 - 32 mmol/L   Glucose, Bld 566 (HH) 70 - 99 mg/dL   BUN 14 6 - 20 mg/dL   Creatinine, Ser 7.82 0.44 - 1.00 mg/dL   Calcium 9.4 8.9 - 95.6 mg/dL   GFR, Estimated >21 >30 mL/min   Anion gap 15 5 - 15  CBC with Differential (PNL)  Result Value Ref Range   WBC 7.3 4.0 - 10.5 K/uL   RBC 6.04 (H) 3.87 - 5.11 MIL/uL   Hemoglobin 14.3 12.0 - 15.0 g/dL   HCT 86.5 (H) 78.4 - 69.6 %   MCV 78.1 (L) 80.0 - 100.0 fL   MCH 23.7 (L) 26.0 - 34.0 pg   MCHC 30.3 30.0 - 36.0 g/dL   RDW 29.5 28.4 - 13.2 %   Platelets 320 150 - 400 K/uL   nRBC 0.0 0.0 - 0.2 %   Neutrophils Relative % 63 %   Neutro Abs 4.6 1.7 - 7.7 K/uL   Lymphocytes Relative 28 %   Lymphs Abs 2.0 0.7 - 4.0 K/uL   Monocytes Relative 8 %   Monocytes Absolute 0.6 0.1 - 1.0 K/uL   Eosinophils Relative 1 %   Eosinophils Absolute 0.1 0.0 - 0.5 K/uL   Basophils Relative 0 %   Basophils Absolute 0.0 0.0 - 0.1 K/uL   Immature Granulocytes 0 %   Abs Immature Granulocytes 0.02 0.00 - 0.07 K/uL  Beta-hydroxybutyric acid  Result Value Ref Range   Beta-Hydroxybutyric Acid 2.99 (H) 0.05 - 0.27 mmol/L  Blood gas, venous  Result Value Ref Range   pH, Ven 7.41 7.25 - 7.43   pCO2, Ven 34 (L) 44 - 60 mmHg   pO2, Ven 69 (H) 32 - 45 mmHg   Bicarbonate 21.7 20.0 - 28.0 mmol/L   Acid-base deficit 2.5 (H) 0.0 - 2.0 mmol/L   O2 Saturation 97.4 %   Patient temperature 36.1   CBG monitoring, ED  Result Value Ref Range   Glucose-Capillary 569 (HH) 70 - 99 mg/dL   Comment 1 Notify RN   CBG monitoring, ED  Result Value Ref Range   Glucose-Capillary 505 (HH) 70 - 99 mg/dL   Comment 1 Notify RN    No results found.   Radiology No results found.  Procedures .Critical Care E&M  Performed  byCy Blamer, MD Critical  care provider statement:    Critical care was necessary to treat or prevent imminent or life-threatening deterioration of the following conditions:  Endocrine crisis   Critical care was time spent personally by me on the following activities:  Ordering and performing treatments and interventions, ordering and review of laboratory studies, ordering and review of radiographic studies, pulse oximetry, re-evaluation of patient's condition and review of old charts   Care discussed with: admitting provider   After initial E/M assessment, critical care services were subsequently performed that were exclusive of separately billable procedures or treatment.       Medications Ordered in ED Medications  lactated ringers bolus 2,494 mL (has no administration in time range)  insulin regular, human (MYXREDLIN) 100 units/ 100 mL infusion (has no administration in time range)  dextrose 50 % solution 0-50 mL (has no administration in time range)  dextrose 5 % and 0.45 % NaCl infusion (0 mLs Intravenous Hold 09/26/23 0135)    ED Course/ Medical Decision Making/ A&P Clinical Course as of 09/26/23 0145  Sun Sep 25, 2023  2319 Beta-Hydroxybutyric Acid(!): 2.99 [AH]    Clinical Course User Index [AH] Arthor Captain, PA-C                                 Medical Decision Making Patient with high sugars at home   Amount and/or Complexity of Data Reviewed Independent Historian:     Details: Relative see above  External Data Reviewed: notes.    Details: Previous notes  Labs: ordered.    Details: Elevated beta hydroxybutyrate 2.99.  urine has glucose and ketones but not UTI.  Sodium 129, but corrects to normal with glucose.  Glucose markedly elevated 566 normal potassium 4.2, normal creatinine 0.93.  pregnancy is negative normal white count 7.3, normal hemoglobin 14.3, normal platelet count  Discussion of management or test interpretation with external provider(s): Dr. Joneen Roach [facility]    Risk Prescription drug management. Decision regarding hospitalization. 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      Leonardo Makris, MD 09/26/23 [phone]

## 2023-09-26 NOTE — Plan of Care (Signed)
Nutrition Education Note   RD consulted for nutrition education regarding diabetes.   No results found for: "HGBA1C"  Reviewed patient's dietary recall. Patient typically eats 3 meals a day. Has cheerios with 2% milk for breakfast, lunch varies, and dinner is usually a meat with a starch and a vegetable. She often snacks on chips. Doesn't often eat a lot of desserts. Drinks mostly water and juice (OJ and apple) and occasionally has tea and soda.   RD provided "Carbohydrate Counting for People with Diabetes" and "Using Nutrition Labels: Carbohydrate" handouts from the Academy of Nutrition and Dietetics. Discussed different food groups and their effects on blood sugar, emphasizing carbohydrate-containing foods. Provided list of carbohydrates and recommended serving sizes of common foods.  Discussed importance of controlled and consistent carbohydrate intake throughout the day. Provided examples of ways to balance meals/snacks and encouraged intake of high-fiber, whole grain complex carbohydrates. Teach back method used.  Expect fair compliance. Patient's mother in room reports she has diabetes herself.  Body mass index is 41.81 kg/m. Pt meets criteria for Morbid Obesity based on current BMI.  Current diet order is Carb Modifed, no % intake of meals documented at this time. Patient reports some ongoing issues with N/V but that it is slowly improving. Had eggs, grits, and some potatoes for breakfast this AM. Labs and medications reviewed. No further nutrition interventions warranted at this time. RD contact information provided. If additional nutrition issues arise, please re-consult RD.  Shelle Iron RD, LDN For contact information, refer to [facility].

## 2023-09-26 NOTE — ED Notes (Signed)
ED TO INPATIENT HANDOFF REPORT  ED Nurse Name and Phone #: Julian Reil [phone]  S Name/Age/Gender Annette Lynch 27 y.o. female Room/Bed: WA18/WA18  Code Status   Code Status: Full Code  Home/SNF/Other Home Patient oriented to: self, place, time, and situation Is this baseline? Yes   Triage Complete: Triage complete  Chief Complaint Hyperosmolar hyperglycemic state (HHS) (HCC) [E11.00]  Triage Note Pt presents for CBG >600 at home along with excessive urination, N/V,  and thirst. Takes metformin typically.      Allergies Allergies  Allergen Reactions   Diphenhydramine Swelling   Motrin [Ibuprofen] Swelling    Level of Care/Admitting Diagnosis ED Disposition     ED Disposition  Admit   Condition  --   Comment  Hospital Area: [facility] [[id number]]  Level of Care: Stepdown [14]  Admit to SDU based on following criteria: Other see comments  Comments: Hyperglycemia  May place patient in observation at [facility] or [facility] if equivalent level of care is available:: Yes  Covid Evaluation: Asymptomatic - no recent exposure (last 10 days) testing not required  Diagnosis: Hyperosmolar hyperglycemic state (HHS) [facility]) [[id number]]  Admitting Physician: Gery Pray [[id number]]  Attending Physician: Alvester Chou          B Medical/Surgery History Past Medical History:  Diagnosis Date   Asthma    History reviewed. No pertinent surgical history.   A IV Location/Drains/Wounds Patient Lines/Drains/Airways Status     Active Line/Drains/Airways     Name Placement date Placement time Site Days   Peripheral IV 09/26/23 20 G Left;Posterior Hand 09/26/23  0148  Hand  less than 1   Peripheral IV 09/26/23 20 G Posterior;Right Hand 09/26/23  0148  Hand  less than 1            Intake/Output Last 24 hours No intake or output data in the 24 hours ending 09/26/23 0416  Labs/Imaging Results for orders placed or performed during the  hospital encounter of 09/25/23 (from the past 48 hour(s))  hCG, serum, qualitative     Status: None   Collection Time: 09/25/23  9:24 PM  Result Value Ref Range   Preg, Serum NEGATIVE NEGATIVE    Comment:        THE SENSITIVITY OF THIS METHODOLOGY IS >10 mIU/mL. Performed at [facility], [address] [place], [place] [address]   Basic metabolic panel     Status: Abnormal   Collection Time: 09/25/23  9:24 PM  Result Value Ref Range   Sodium 129 (L) 135 - 145 mmol/L   Potassium 4.2 3.5 - 5.1 mmol/L   Chloride 93 (L) 98 - 111 mmol/L   CO2 21 (L) 22 - 32 mmol/L   Glucose, Bld 566 (HH) 70 - 99 mg/dL    Comment: CRITICAL RESULT CALLED TO, READ BACK BY AND VERIFIED WITH GREY, S RN @2227  09/25/23. gilbertl Glucose reference range applies only to samples taken after fasting for at least 8 hours.    BUN 14 6 - 20 mg/dL   Creatinine, Ser 8.29 0.44 - 1.00 mg/dL   Calcium 9.4 8.9 - 56.2 mg/dL   GFR, Estimated >13 >08 mL/min    Comment: (NOTE) Calculated using the CKD-EPI Creatinine Equation (2021)    Anion gap 15 5 - 15    Comment: Performed at [facility], [address] [place], [place] [address]  CBC with Differential (PNL)     Status: Abnormal   Collection Time:  09/25/23  9:24 PM  Result Value Ref Range   WBC 7.3 4.0 - 10.5 K/uL   RBC 6.04 (H) 3.87 - 5.11 MIL/uL   Hemoglobin 14.3 12.0 - 15.0 g/dL   HCT 16.1 (H) 09.6 - 04.5 %   MCV 78.1 (L) 80.0 - 100.0 fL   MCH 23.7 (L) 26.0 - 34.0 pg   MCHC 30.3 30.0 - 36.0 g/dL   RDW 40.9 81.1 - 91.4 %   Platelets 320 150 - 400 K/uL   nRBC 0.0 0.0 - 0.2 %   Neutrophils Relative % 63 %   Neutro Abs 4.6 1.7 - 7.7 K/uL   Lymphocytes Relative 28 %   Lymphs Abs 2.0 0.7 - 4.0 K/uL   Monocytes Relative 8 %   Monocytes Absolute 0.6 0.1 - 1.0 K/uL   Eosinophils Relative 1 %   Eosinophils Absolute 0.1 0.0 - 0.5 K/uL   Basophils Relative 0 %   Basophils Absolute 0.0 0.0 - 0.1 K/uL   Immature Granulocytes  0 %   Abs Immature Granulocytes 0.02 0.00 - 0.07 K/uL    Comment: Performed at [facility], [address], [place], [place] [address]  Beta-hydroxybutyric acid     Status: Abnormal   Collection Time: 09/25/23  9:24 PM  Result Value Ref Range   Beta-Hydroxybutyric Acid 2.99 (H) 0.05 - 0.27 mmol/L    Comment: Performed at [facility], [address], [place], [place] [address]  CBG monitoring, ED     Status: Abnormal   Collection Time: 09/25/23  9:28 PM  Result Value Ref Range   Glucose-Capillary 569 (HH) 70 - 99 mg/dL    Comment: Glucose reference range applies only to samples taken after fasting for at least 8 hours.   Comment 1 Notify RN   Urinalysis, Routine w reflex microscopic -Urine, Clean Catch     Status: Abnormal   Collection Time: 09/25/23  9:53 PM  Result Value Ref Range   Color, Urine COLORLESS (A) YELLOW   APPearance CLEAR CLEAR   Specific Gravity, Urine 1.032 (H) 1.005 - 1.030   pH 5.0 5.0 - 8.0   Glucose, UA >=500 (A) NEGATIVE mg/dL   Hgb urine dipstick NEGATIVE NEGATIVE   Bilirubin Urine NEGATIVE NEGATIVE   Ketones, ur 20 (A) NEGATIVE mg/dL   Protein, ur NEGATIVE NEGATIVE mg/dL   Nitrite NEGATIVE NEGATIVE   Leukocytes,Ua NEGATIVE NEGATIVE   RBC / HPF 0-5 0 - 5 RBC/hpf   WBC, UA 0-5 0 - 5 WBC/hpf   Bacteria, UA NONE SEEN NONE SEEN   Squamous Epithelial / HPF 0-5 0 - 5 /HPF    Comment: Performed at [facility], [address] [place], [place] [address]  CBG monitoring, ED     Status: Abnormal   Collection Time: 09/26/23 12:48 AM  Result Value Ref Range   Glucose-Capillary 505 (HH) 70 - 99 mg/dL    Comment: Glucose reference range applies only to samples taken after fasting for at least 8 hours.   Comment 1 Notify RN   Blood gas, venous     Status: Abnormal   Collection Time: 09/26/23  1:55 AM  Result Value Ref Range   pH, Ven 7.41 7.25 - 7.43   pCO2, Ven 34 (L) 44 - 60 mmHg   pO2, Ven 69 (H) 32 - 45  mmHg   Bicarbonate 21.7 20.0 - 28.0 mmol/L   Acid-base deficit 2.5 (H) 0.0 - 2.0 mmol/L   O2 Saturation 97.4 %  Patient temperature 36.1     Comment: Performed at [facility], [address] [place], [place] [address]  CBG monitoring, ED     Status: Abnormal   Collection Time: 09/26/23  2:41 AM  Result Value Ref Range   Glucose-Capillary 347 (H) 70 - 99 mg/dL    Comment: Glucose reference range applies only to samples taken after fasting for at least 8 hours.  CBG monitoring, ED     Status: Abnormal   Collection Time: 09/26/23  4:07 AM  Result Value Ref Range   Glucose-Capillary 286 (H) 70 - 99 mg/dL    Comment: Glucose reference range applies only to samples taken after fasting for at least 8 hours.   No results found.  Pending Labs Unresulted Labs (From admission, onward)     Start     Ordered   10/03/23 0500  Creatinine, serum  (enoxaparin (LOVENOX)    CrCl >/= 30 ml/min)  Weekly,   R     Comments: while on enoxaparin therapy    09/26/23 0359   09/26/23 0500  CBC with Differential/Platelet  Tomorrow morning,   R        09/26/23 0359   09/26/23 0500  Basic metabolic panel  Daily,   R      09/26/23 0411            Vitals/Pain Today's Vitals   09/25/23 2124 09/26/23 0048 09/26/23 0124 09/26/23 0330  BP:  (!) 144/93  111/62  Pulse:  (!) 107 (!) 109 100  Resp:  18  18  Temp:   98 F (36.7 C)   TempSrc:   Oral   SpO2:  97% 98% 97%  Weight: 124.7 kg     PainSc:        Isolation Precautions No active isolations  Medications Medications  insulin regular, human (MYXREDLIN) 100 units/ 100 mL infusion (7 Units/hr Intravenous Rate/Dose Change 09/26/23 0250)  dextrose 50 % solution 0-50 mL (has no administration in time range)  dextrose 5 % and 0.45 % NaCl infusion (0 mLs Intravenous Hold 09/26/23 0135)  enoxaparin (LOVENOX) injection 40 mg (has no administration in time range)  acetaminophen (TYLENOL) tablet 650 mg (has no administration in  time range)    Or  acetaminophen (TYLENOL) suppository 650 mg (has no administration in time range)  senna-docusate (Senokot-S) tablet 1 tablet (has no administration in time range)  ondansetron (ZOFRAN) tablet 4 mg (has no administration in time range)    Or  ondansetron (ZOFRAN) injection 4 mg (has no administration in time range)  insulin glargine-yfgn (SEMGLEE) injection 18 Units (has no administration in time range)  insulin aspart (novoLOG) injection 0-15 Units (has no administration in time range)  insulin aspart (novoLOG) injection 0-5 Units (has no administration in time range)  lactated ringers bolus 2,494 mL (2,494 mLs Intravenous Bolus 09/26/23 0154)    Mobility walks     Focused Assessments    R Recommendations: See Admitting Provider Note  Report given to:   Additional Notes:

## 2023-09-26 NOTE — Telephone Encounter (Signed)
Pharmacy Patient Advocate Encounter   Received notification that prior authorization for FreeStyle Libre 3 Sensor is required/requested.   Insurance verification completed.   The patient is insured through [facility] [place] .   Per test claim: PA required; PA submitted to [facility] Medicaid via CoverMyMeds Key/confirmation #/[id number] Status is pending

## 2023-09-26 NOTE — Progress Notes (Signed)
  Progress Note   Patient: Annette Lynch VWU:[id number] DOB: Jan 08, 1996 DOA: 09/25/2023     0 DOS: the patient was seen and examined on 09/26/2023   Brief hospital course: 27 year old female with history of extreme morbid obesity, PCOS.  Patient maintained on metformin 500 mg daily for PCOS.  Per patient she came in because she is just not been feeling well the past 2 to 3 days.  She has had nausea, vomiting.  She has been sleeping a lot and just generally elevated.  She checked her sugars on Saturday and Sunday and they were both elevated >600.  Patient does not typically check her sugars.  Patient endorses polyuria, polydipsia, some abdominal discomfort.   In the ER patient's glucose 566, anion gap 15, pH seven 7.41, elevated beta hydroxybutyric acid at 2.99.  She was started on DKA protocol.  Assessment and Plan: Present on Admission:  New diagnosis of diabetes mellitus without DKA present on admit -Presenting glucose in excess of 500's, only on metformin for PCOS -Pt now weaned off insulin gtt -Still needing multiple doses of additional insulin throughout the day -Appreciate input by diabetic coordinator. Recs to increase semglee to 25 units with 6 units meal coverage -A1c nding   PCOS -Had been on metformin PTA -Seems stable at this time   Subjective: Without complaints this AM  Physical Exam: Vitals:   09/26/23 1300 09/26/23 1400 09/26/23 1500 09/26/23 1700  BP: (!) 164/86  (!) 116/56 (!) 140/78  Pulse: 93 (!) 104 88 (!) 103  Resp: (!) 22 16 (!) 0 (!) 22  Temp:      TempSrc:      SpO2: 99% 100% 98% 100%  Weight:       General exam: Awake, laying in bed, in nad Respiratory system: Normal respiratory effort, no wheezing Cardiovascular system: regular rate, s1, s2 Gastrointestinal system: Soft, nondistended, positive BS Central nervous system: CN2-12 grossly intact, strength intact Extremities: Perfused, no clubbing Skin: Normal skin turgor, no notable skin lesions  seen Psychiatry: Mood normal // no visual hallucinations   Data Reviewed:  Labs reviewed: Na 134, K 3.2, Cr 0.59, WBC 7.2, Hgb 13.2  Family Communication: Pt in room, family at bedside  Disposition: Status is: Observation The patient will require care spanning > 2 midnights and should be moved to inpatient because: Severity of illness  Planned Discharge Destination: Home     Author: Rickey Barbara, MD 09/26/2023 5:55 PM  For on call review [email or web address].

## 2023-09-26 NOTE — Inpatient Diabetes Management (Addendum)
Inpatient Diabetes Program Recommendations  AACE/ADA: New Consensus Statement on Inpatient Glycemic Control (2015)  Target Ranges:  Prepandial:   less than 140 mg/dL      Peak postprandial:   less than 180 mg/dL (1-2 hours)      Critically ill patients:  140 - 180 mg/dL    Latest Reference Range & Units 09/25/23 21:24  Sodium 135 - 145 mmol/L 129 (L)  Potassium 3.5 - 5.1 mmol/L 4.2  Chloride 98 - 111 mmol/L 93 (L)  CO2 22 - 32 mmol/L 21 (L)  Glucose 70 - 99 mg/dL 409 (HH)  BUN 6 - 20 mg/dL 14  Creatinine 8.11 - 9.14 mg/dL 7.82  Calcium 8.9 - 95.6 mg/dL 9.4  Anion gap 5 - 15  15  GFR, Estimated >60 mL/min >60    Latest Reference Range & Units 09/25/23 21:28 09/26/23 00:48 09/26/23 02:41 09/26/23 04:07 09/26/23 05:23  Glucose-Capillary 70 - 99 mg/dL 213 (HH) 086 (HH)  IV Insulin Drip Started @ 0152 347 (H) 286 (H) 261 (H)  18 units Semglee @0608   (HH): Data is critically high (H): Data is abnormally high  Latest Reference Range & Units 09/26/23 07:23 09/26/23 11:57 09/26/23 12:52  Glucose-Capillary 70 - 99 mg/dL 578 (H)  5 units Novolog  417 (H)  15 units Novolog  336 (H)  (H): Data is abnormally high    Admit with: Hyperglycemia with nausea and vomiting/ New Diagnosis Diabetes  History: Morbid Obesity, PCOS  Home Meds: Metformin 500 mg daily for PCOS  Current Orders: IV Insulin Drip    Transitioning to Semglee 18 units Q24H + Novolog Moderate Correction Scale/ SSI (0-15 units) TID AC + HS     PCP: Nathaneil Canary, PA-C  MD- Please add Hemoglobin A1c to labs  Please also consider:  1. Increase Semglee to 25 units daily (0.2 units/kg) May want to give an extra 7 units Semglee X 1 dose this afternoon  2. Start Novolog Meal Coverage: Novolog 6 units TID with meals HOLD if pt NPO HOLD if pt eats <50% meals     Will order educational materials and plan to see pt today to discuss new diagnosis diabetes  Insulins Rxs should be $4 with her Medicaid  Coverage-- Lantus and Novolog are both covered under [place] Medicaid Formulary    Addendum 12:45pm--Met w/ pt at bedside to discuss new diagnosis diabetes.  Pt A&O and open to conversation.  Pt told me her Mom and Granddad both have diabetes.  She knows how to check CBGs but knows little about diabetes.  I reviewed with pt her risk factors and explained how a pt can manage sugrs with nutrition, exercise, and medications.  Pt had appt with PCP today to review diabetes symptoms--I asked pt to call PCP to let them know she is in the hospital, has been diagnosed with diabetes, and asked pt to re-schedule next appt with PCP within the next week.  Spoke with pt about new diagnosis.  Explained what an A1C is, basic pathophysiology of DM Type 2, basic home care, basic diabetes diet nutrition principles, importance of checking CBGs and maintaining good CBG control to prevent long-term and short-term complications.  Reviewed signs and symptoms of hyperglycemia and hypoglycemia and how to treat hypoglycemia at home.  Also reviewed blood sugar goals and A1c goals for home.    RNs to provide ongoing basic DM education at bedside with this patient.  Have ordered educational booklet, insulin starter kit.  Have also placed RD  consult for DM diet education for this patient.  Ordered OP DM edu consult to [facility] and attached several educational videos (QR codes) to pt's AVS (pt stated she knows how to use camera on phone for QR code video).  Educated patient and spouse on insulin pen use at home.  Reviewed all steps of insulin pen including attachment of needle, 2-unit air shot, dialing up dose, giving injection, rotation of injection sites, removing needle, disposal of sharps, storage of unused insulin, disposal of insulin etc.  Patient able to provide successful return demonstration.  Reviewed troubleshooting with insulin pen.  Also reviewed Signs/Symptoms of Hypoglycemia with patient and how to treat Hypoglycemia  at home.  Have asked RNs caring for patient to please allow patient to give all injections here in hospital as much as possible for practice.  MD to give patient Rxs for insulin pens and insulin pen needles.         --Will follow patient during hospitalization--  Ambrose Finland RN, MSN, CDCES Diabetes Coordinator Inpatient Glycemic Control Team Team Pager: [phone] (8a-5p)

## 2023-09-26 NOTE — Hospital Course (Signed)
HPI: 27 year old female with history of extreme morbid obesity, PCOS.  Patient maintained on metformin 500 mg daily for PCOS.  Per patient she came in because she is just not been feeling well the past 2 to 3 days.  She has had nausea, vomiting.  She has been sleeping a lot and just generally elevated.  She checked her sugars on Saturday and Sunday and they were both elevated >600.  Patient does not typically check her sugars.  Patient endorses polyuria, polydipsia, some abdominal discomfort.   In the ER patient's glucose 566, anion gap 15, pH seven 7.41, elevated beta hydroxybutyric acid at 2.99.  She was started on DKA protocol.  Significant Events: Admitted 09/25/2023 new onset DM and hyperosmolar hyperglycemic state   Significant Labs: Admitting serum glucose 566, bicarb 21, BHA 2.99  Significant Imaging Studies:   Antibiotic Therapy: Anti-infectives (From admission, onward)    None       Procedures:   Consultants:

## 2023-09-26 NOTE — TOC Benefit Eligibility Note (Signed)
Patient Product/process development scientist completed.    The patient is insured through [employer].     Ran test claim for Lantus Pen and the current 30 day co-pay is $4.00.  Ran test claim for Novolog Pen and the current 30 day co-pay is $4.00.  Ran test claim for [employer] 3 Sensor and Requires Prior Authorization    This test claim was processed through [employer]- copay amounts may vary at other pharmacies due to [employer], or as the patient moves through the different stages of their insurance plan.     Roland Earl, CPHT Pharmacy Technician III Certified Patient Advocate [facility] Patient Advocate Team Direct Number: [phone]  Fax: [phone]

## 2023-09-26 NOTE — H&P (Addendum)
PCP:   Annette Canary, PA-C   Chief Complaint:  Hyperglycemia with nausea and vomiting  HPI: This is a 27 year old female with history of extreme morbid obesity, PCOS.  Patient maintained on metformin 500 mg daily for PCOS.  Per patient she came in because she is just not been feeling well the past 2 to 3 days.  She has had nausea, vomiting.  She has been sleeping a lot and just generally elevated.  She checked her sugars on Saturday and Sunday and they were both elevated >600.  Patient does not typically check her sugars.  Patient endorses polyuria, polydipsia, some abdominal discomfort.  In the ER patient's glucose 566, anion gap 15, pH seven 7.41, elevated beta hydroxybutyric acid at 2.99.  She was started on DKA protocol.  Review of Systems:  Per HPI  Past Medical History: Past Medical History:  Diagnosis Date   Asthma    History reviewed. No pertinent surgical history.  Medications: Prior to Admission medications   Medication Sig Start Date End Date Taking? Authorizing Provider  cephALEXin (KEFLEX) 500 MG capsule Take 1 capsule (500 mg total) by mouth 4 (four) times daily. 11/04/17   Annette Lynch B, PA-C    Allergies:   Allergies  Allergen Reactions   Diphenhydramine Swelling   Motrin [Ibuprofen] Swelling    Social History:  reports that she has never smoked. She does not have any smokeless tobacco history on file. She reports that she does not drink alcohol and does not use drugs.  Family History: History reviewed. No pertinent family history.  Physical Exam: Vitals:   09/25/23 2122 09/25/23 2124 09/26/23 0048 09/26/23 0124  BP: (!) 152/94  (!) 144/93   Pulse: (!) 105  (!) 107 (!) 109  Resp: (!) 22  18   Temp: 97.8 F (36.6 C)   98 F (36.7 C)  TempSrc: Oral   Oral  SpO2: 94%  97% 98%  Weight:  124.7 kg      General:  Alert and oriented times three, extreme morbid obesity, no acute distress Eyes: PERRLA, pink conjunctiva, no scleral icterus ENT: Moist  oral mucosa, neck supple, no thyromegaly Lungs: clear to ascultation, no wheeze, no crackles, no use of accessory muscles Cardiovascular: regular rate and rhythm, no regurgitation, no gallops, no murmurs. No carotid bruits, no JVD Abdomen: soft, positive BS, non-tender, non-distended, no organomegaly, not an acute abdomen GU: not examined Neuro: CN II - XII grossly intact, sensation intact Musculoskeletal: strength 5/5 all extremities, no clubbing, cyanosis or edema Skin: no rash, no subcutaneous crepitation, no decubitus Psych: appropriate patient   Labs on Admission:  Recent Labs    09/25/23 2124  NA 129*  K 4.2  CL 93*  CO2 21*  GLUCOSE 566*  BUN 14  CREATININE 0.94  CALCIUM 9.4    Recent Labs    09/25/23 2124  WBC 7.3  NEUTROABS 4.6  HGB 14.3  HCT 47.2*  MCV 78.1*  PLT 320    Radiological Exams on Admission: No results found.  Assessment/Plan Present on Admission:  Hyperglycemia -Not DKA.  Current glucose 286, will transition to subcu Lantus and sliding scale. -Nutrition educator consulted for diabetic education -Heart healthy diet ordered -Corrected sodium 138.   PCOS -Resume metformin at discharge   Annette Lynch 09/26/2023, 1:59 AM

## 2023-09-26 NOTE — Telephone Encounter (Signed)
Pharmacy Patient Advocate Encounter  Received notification from [facility] Medicaid that Prior Authorization for FreeStyle Libre 3 Sensor  has been APPROVED from 09/26/2023 to 03/24/2024. Ran test claim, Copay is $0.00. This test claim was processed through [facility]- copay amounts may vary at other pharmacies due to pharmacy/plan contracts, or as the patient moves through the different stages of their insurance plan.   PA #/Case ID/Reference #: [id number]

## 2023-09-27 DIAGNOSIS — E1100 Type 2 diabetes mellitus with hyperosmolarity without nonketotic hyperglycemic-hyperosmolar coma (NKHHC): Secondary | ICD-10-CM

## 2023-09-27 LAB — GLUCOSE, CAPILLARY
Glucose-Capillary: 328 mg/dL — ABNORMAL HIGH (ref 70–99)
Glucose-Capillary: 438 mg/dL — ABNORMAL HIGH (ref 70–99)
Glucose-Capillary: 472 mg/dL — ABNORMAL HIGH (ref 70–99)
Glucose-Capillary: 279 mg/dL — ABNORMAL HIGH (ref 70–99)
Glucose-Capillary: 322 mg/dL — ABNORMAL HIGH (ref 70–99)
Glucose-Capillary: 442 mg/dL — ABNORMAL HIGH (ref 70–99)

## 2023-09-27 LAB — BASIC METABOLIC PANEL
Anion gap: 9 (ref 5–15)
BUN: 15 mg/dL (ref 6–20)
CO2: 25 mmol/L (ref 22–32)
Calcium: 8.7 mg/dL — ABNORMAL LOW (ref 8.9–10.3)
Chloride: 100 mmol/L (ref 98–111)
Creatinine, Ser: 0.73 mg/dL (ref 0.44–1.00)
GFR, Estimated: >60 mL/min (ref >60)
Glucose, Bld: 380 mg/dL — ABNORMAL HIGH (ref 70–99)
Potassium: 4.1 mmol/L (ref 3.5–5.1)
Sodium: 134 mmol/L — ABNORMAL LOW (ref 135–145)

## 2023-09-27 MED ORDER — INSULIN GLARGINE-YFGN 100 UNIT/ML ~~LOC~~ SOLN
35.0000 [IU] | Freq: Every day | SUBCUTANEOUS | Status: DC
  Filled 2023-09-27: qty 0.35

## 2023-09-27 MED ORDER — INSULIN ASPART 100 UNIT/ML IJ SOLN: 0.0000 [IU] | Freq: Every day | INTRAMUSCULAR | Status: DC

## 2023-09-27 MED ORDER — INSULIN ASPART 100 UNIT/ML IJ SOLN
10.0000 [IU] | Freq: Three times a day (TID) | INTRAMUSCULAR | Status: DC
  Administered 2023-09-27: 10 [IU] via SUBCUTANEOUS

## 2023-09-27 MED ORDER — INSULIN ASPART 100 UNIT/ML IJ SOLN
0.0000 [IU] | Freq: Three times a day (TID) | INTRAMUSCULAR | Status: DC
  Administered 2023-09-27: 15 [IU] via SUBCUTANEOUS
  Administered 2023-09-28: 11 [IU] via SUBCUTANEOUS

## 2023-09-27 MED ORDER — INSULIN ASPART 100 UNIT/ML IJ SOLN
10.0000 [IU] | Freq: Once | INTRAMUSCULAR | Status: AC
  Administered 2023-09-27: 10 [IU] via SUBCUTANEOUS

## 2023-09-27 MED ORDER — INSULIN GLARGINE-YFGN 100 UNIT/ML ~~LOC~~ SOLN
10.0000 [IU] | SUBCUTANEOUS | Status: AC
  Administered 2023-09-27: 10 [IU] via SUBCUTANEOUS
  Filled 2023-09-27: qty 0.1

## 2023-09-27 NOTE — Progress Notes (Signed)
  Progress Note   Patient: Annette Lynch ZOX:[id number] DOB: 1996/02/02 DOA: 09/25/2023     1 DOS: the patient was seen and examined on 09/27/2023   Brief hospital course: 27 year old female with history of extreme morbid obesity, PCOS.  Patient maintained on metformin 500 mg daily for PCOS.  Per patient she came in because she is just not been feeling well the past 2 to 3 days.  She has had nausea, vomiting.  She has been sleeping a lot and just generally elevated.  She checked her sugars on Saturday and Sunday and they were both elevated >600.  Patient does not typically check her sugars.  Patient endorses polyuria, polydipsia, some abdominal discomfort.   In the ER patient's glucose 566, anion gap 15, pH seven 7.41, elevated beta hydroxybutyric acid at 2.99.  She was started on DKA protocol.  Assessment and Plan: Present on Admission:  New diagnosis of diabetes mellitus without DKA present on admit -Presenting glucose in excess of 500's, only on metformin for PCOS -Pt now weaned off insulin gtt -Appreciate input by diabetic coordinator. Glucose remains very poorly controlled into the 400's. Have increased semglee to 35 units and aspart to 10 units with meals -SSI increased to resistant scale -A1c of 10.1   PCOS -Had been on metformin PTA -Seems stable at this time   Subjective: Currently without complaints. No abd pain or diarrhea  Physical Exam: Vitals:   09/26/23 1920 09/26/23 2041 09/27/23 0426 09/27/23 1219  BP:  (!) 156/91 127/65 (!) 156/79  Pulse:  (!) 103 92 90  Resp:  16 16 16   Temp: 98.2 F (36.8 C) 97.6 F (36.4 C) 98.7 F (37.1 C) 98.6 F (37 C)  TempSrc: Oral   Oral  SpO2:  100% 98% 99%  Weight:       General exam: Conversant, in no acute distress Respiratory system: normal chest rise, clear, no audible wheezing Cardiovascular system: regular rhythm, s1-s2 Gastrointestinal system: Nondistended, nontender, pos BS Central nervous system: No seizures, no  tremors Extremities: No cyanosis, no joint deformities Skin: No rashes, no pallor Psychiatry: Affect normal // no auditory hallucinations   Data Reviewed:  Labs reviewed: Na 134, K 4.1, Cr 0.73  Family Communication: Pt in room, family at bedside  Disposition: Status is: Inpatient Continue inpatient stay because: Severity of illness  Planned Discharge Destination: Home    Author: Rickey Barbara, MD 09/27/2023 7:16 PM  For on call review [email or web address].

## 2023-09-27 NOTE — TOC Initial Note (Signed)
Transition of Care [facility]) - Initial/Assessment Note    Patient Details  Name: Annette Lynch MRN: [id number] Date of Birth: 06-Feb-1996  Transition of Care [facility]) CM/SW Contact:    Harriett Sine, RN Phone Number:[phone]  09/27/2023, 11:09 AM  Clinical Narrative:                 Pt from home with family, pt has pcp, Spoke with pt at bedside about wound care and d/c plans. Pt states she will need help with wound care. No SDOH needs at time, [facility] following  Expected Discharge Plan: Home w Home Health Services Barriers to Discharge: No Barriers Identified   Patient Goals and CMS Choice   CMS Medicare.gov Compare Post Acute Care list provided to:: Patient Choice offered to / list presented to : NA      Expected Discharge Plan and Services   Discharge Planning Services:  (none)   Living arrangements for the past 2 months: Single Family Home                 DME Arranged:  (waiting for eval)         HH Arranged:  (waiting for eval)          Prior Living Arrangements/Services Living arrangements for the past 2 months: Single Family Home Lives with:: Parents Patient language and need for interpreter reviewed:: Yes        Need for Family Participation in Patient Care: No (Comment) Care giver support system in place?: Yes (comment) Current home services:  (waiting for eval) Criminal Activity/Legal Involvement Pertinent to Current Situation/Hospitalization: No - Comment as needed  Activities of Daily Living      Permission Sought/Granted Permission sought to share information with : Facility Industrial/product designer granted to share information with : Yes, Verbal Permission Granted              Emotional Assessment Appearance:: Appears stated age Attitude/Demeanor/Rapport: Engaged Affect (typically observed): Calm Orientation: : Oriented to Self, Oriented to Place, Oriented to  Time Alcohol / Substance Use: Never Used Psych Involvement: No  (comment)  Admission diagnosis:  Diabetic ketoacidosis without coma associated with other specified diabetes mellitus (HCC) [E13.10] Hyperosmolar hyperglycemic state (HHS) (HCC) [E11.00] Hyperglycemia [R73.9] Patient Active Problem List   Diagnosis Date Noted   DKA (diabetic ketoacidosis) (HCC) 09/26/2023   PCOS (polycystic ovarian syndrome) 09/26/2023   Hyperosmolar hyperglycemic state (HHS) (HCC) 09/26/2023   Hyperglycemia 09/26/2023   PCP:  Nathaneil Canary, PA-C Pharmacy:   [facility] [phone] - [place], [place] - [address] AT [facility] [address] [place] [address] Phone: [phone] Fax: [phone]     Social Determinants of Health (SDOH) Social History: SDOH Screenings   Food Insecurity: No Food Insecurity (01/03/2023)   Received from [employer] Needs: No Transportation Needs (01/03/2023)   Received from [facility]  Utilities: Not At Risk (01/03/2023)   Received from [facility]  Financial Resource Strain: Low Risk  (01/03/2023)   Received from [facility]  Physical Activity: Insufficiently Active (11/26/2022)   Received from [facility]  Social Connections: Socially Integrated (11/26/2022)   Received from [facility]  Stress: No Stress Concern Present (11/26/2022)   Received from [facility]  Tobacco Use: Unknown (09/25/2023)   SDOH Interventions:     Readmission Risk Interventions     No data to display

## 2023-09-27 NOTE — Progress Notes (Signed)
BG checked at 0740 am 09/27/23. Chiu reached out via epic chat, wanting maximum dose of SSI given. Dose delivered at 0843, along with scheduled 6 units insulin, and long acting. BG rechecked at 0937 per physician request, 472. Rhona Leavens made aware via epic chat.

## 2023-09-27 NOTE — Plan of Care (Signed)

## 2023-09-27 NOTE — Inpatient Diabetes Management (Addendum)
Inpatient Diabetes Program Recommendations  AACE/ADA: New Consensus Statement on Inpatient Glycemic Control (2015)  Target Ranges:  Prepandial:   less than 140 mg/dL      Peak postprandial:   less than 180 mg/dL (1-2 hours)      Critically ill patients:  140 - 180 mg/dL   Lab Results  Component Value Date   GLUCAP 472 (H) 09/27/2023   HGBA1C 10.1 (H) 09/26/2023    Latest Reference Range & Units 09/27/23 07:40 09/27/23 09:37 09/27/23 12:22  Glucose-Capillary 70 - 99 mg/dL 213 (H) 086 (H) 578 (H)  (H): Data is abnormally high  Admit with: Hyperglycemia with nausea and vomiting/ New Diagnosis Diabetes   History: Morbid Obesity, PCOS   Home Meds: Metformin 500 mg daily for PCOS Current orders for Inpatient glycemic control: Semglee 35 units at bedtime, Novolog 10 units tid meal coverage, Novolog 0-20 units tid, 0-5 units hs correction  Inpatient Diabetes Program Recommendations:   Noted increased doses of Semglee and Novolog due to increased CBGs. MD ordered application of Freestyle CGM at discharge for patient. Education done regarding application and changing CGM sensor (alternate every 14 days on back of arms), 1 hour warm-up, use of glucometer when alert displays, how to scan CGM for first glucose reading and information for PCP. Patient has also been given educational packet regarding use CGM sensor including the 1-800 toll free number for any questions, problems or needs related to the [employer] sensors or reader.    Sensor applied by patient to (L) Arm at 1145a.  Explained that glucose readings will not be available until 1 hour after application. Reviewed use of CGM including how to scan, changing Sensor, Vitamin C warning, arrows with glucose readings, and Freestyle app.  Patient very  appreciative.   Addendum 2:07  Discharge needs. Also placed D/C recs. Order # [id number] Josephine Igo 3 prior authorization approved for $0 copay.  Thank you, Billy Fischer. Kamaron Deskins, RN, MSN, CDCES  Diabetes  Coordinator Inpatient Glycemic Control Team Team Pager [phone] (8am-5pm) 09/27/2023 12:33 PM

## 2023-09-28 ENCOUNTER — Other Ambulatory Visit (HOSPITAL_COMMUNITY): Payer: Self-pay

## 2023-09-28 DIAGNOSIS — E1100 Type 2 diabetes mellitus with hyperosmolarity without nonketotic hyperglycemic-hyperosmolar coma (NKHHC): Secondary | ICD-10-CM | POA: Diagnosis not present

## 2023-09-28 LAB — GLUCOSE, CAPILLARY
Glucose-Capillary: 289 mg/dL — ABNORMAL HIGH (ref 70–99)
Glucose-Capillary: 337 mg/dL — ABNORMAL HIGH (ref 70–99)

## 2023-09-28 LAB — CBC
HCT: 42.2 % (ref 36.0–46.0)
Hemoglobin: 12.9 g/dL (ref 12.0–15.0)
MCH: 24.3 pg — ABNORMAL LOW (ref 26.0–34.0)
MCHC: 30.6 g/dL (ref 30.0–36.0)
MCV: 79.6 fL — ABNORMAL LOW (ref 80.0–100.0)
Platelets: 254 10*3/uL (ref 150–400)
RBC: 5.3 MIL/uL — ABNORMAL HIGH (ref 3.87–5.11)
RDW: 14.5 % (ref 11.5–15.5)
WBC: 5.9 10*3/uL (ref 4.0–10.5)
nRBC: 0 % (ref 0.0–0.2)

## 2023-09-28 LAB — BASIC METABOLIC PANEL
Anion gap: 9 (ref 5–15)
BUN: 14 mg/dL (ref 6–20)
CO2: 24 mmol/L (ref 22–32)
Calcium: 9.1 mg/dL (ref 8.9–10.3)
Chloride: 103 mmol/L (ref 98–111)
Creatinine, Ser: 0.63 mg/dL (ref 0.44–1.00)
GFR, Estimated: >60 mL/min (ref >60)
Glucose, Bld: 315 mg/dL — ABNORMAL HIGH (ref 70–99)
Potassium: 3.8 mmol/L (ref 3.5–5.1)
Sodium: 136 mmol/L (ref 135–145)

## 2023-09-28 MED ORDER — INSULIN ASPART 100 UNIT/ML CARTRIDGE (PENFILL)
15.0000 [IU] | Freq: Three times a day (TID) | SUBCUTANEOUS | 0 refills | Status: AC
  Filled 2023-09-28: qty 15, 34d supply, fill #0

## 2023-09-28 MED ORDER — INSULIN GLARGINE 100 UNIT/ML SOLOSTAR PEN
45.0000 [IU] | PEN_INJECTOR | Freq: Every morning | SUBCUTANEOUS | 0 refills | Status: AC
  Filled 2023-09-28: qty 15, 30d supply, fill #0

## 2023-09-28 MED ORDER — PEN NEEDLES 31G X 5 MM MISC
1.0000 | Freq: Four times a day (QID) | 0 refills | Status: AC
  Filled 2023-09-28: qty 100, 25d supply, fill #0

## 2023-09-28 MED ORDER — INSULIN ASPART 100 UNIT/ML IJ SOLN
15.0000 [IU] | Freq: Three times a day (TID) | INTRAMUSCULAR | Status: DC
  Administered 2023-09-28: 15 [IU] via SUBCUTANEOUS

## 2023-09-28 MED ORDER — INSULIN GLARGINE-YFGN 100 UNIT/ML ~~LOC~~ SOLN
45.0000 [IU] | Freq: Every day | SUBCUTANEOUS | Status: DC
  Administered 2023-09-28: 45 [IU] via SUBCUTANEOUS
  Filled 2023-09-28: qty 0.45

## 2023-09-28 MED ORDER — INSULIN ASPART 100 UNIT/ML FLEXPEN
PEN_INJECTOR | SUBCUTANEOUS | 0 refills | Status: AC
  Filled 2023-09-28: qty 15, 30d supply, fill #0

## 2023-09-28 MED ORDER — GVOKE HYPOPEN 2-PACK 1 MG/0.2ML ~~LOC~~ SOAJ
1.0000 mg | SUBCUTANEOUS | 0 refills | Status: AC | PRN
  Filled 2023-09-28: qty 0.4, 28d supply, fill #0

## 2023-09-28 MED ORDER — CONTINUOUS GLUCOSE SENSOR MISC
1.0000 [IU] | 0 refills | Status: AC
  Filled 2023-09-28: qty 1, 14d supply, fill #0

## 2023-09-28 NOTE — Assessment & Plan Note (Signed)
Pt admitted for HHS without coma. Placed on IV insulin protocol. Changed over to SQ insulin. Pt seen by inpatient diabetes coordinator. Pt taught how to use insulin pens. Pt placed on novolog and glargine. Freestyle Libre 3 CGM started on pt. Insurance authorization obtained for her CGM. Pt will be discharged to home on SQ novoplog and lantus.  I have message her PCP at [facility] to see if pt can be seen this week or early next week for hospital f/u for her DM.

## 2023-09-28 NOTE — Subjective & Objective (Signed)
Pt seen and examined. Family at bedside Pt wearing CBG. Able to check her BS with her smartphone. Advised she get a paper notebook and write down all her insulin doses and what type of insulin she took with date and  time. Stable for DC today.

## 2023-09-28 NOTE — Discharge Summary (Signed)
[facility] Phys[facility] Discharge Summary   Patient name: Annette Lynch  Admit date:     09/25/2023  Discharge date: 09/28/2023  Attending Physician: Jerald Kief [[id number]]  Discharge Physician: Carollee Herter   PCP: Nathaneil Canary, PA-C  Admitted From: Home  Disposition:  Home  Recommendations for Outpatient Follow-up:  Follow up with PCP in 1 week for hospital F/U and DM management  Home Health:No Equipment/Devices: None    Discharge Condition:Stable CODE STATUS:FULL Diet recommendation: Diabetic Fluid Restriction: None  Hospital Summary: HPI: 27 year old female with history of extreme morbid obesity, PCOS.  Patient maintained on metformin 500 mg daily for PCOS.  Per patient she came in because she is just not been feeling well the past 2 to 3 days.  She has had nausea, vomiting.  She has been sleeping a lot and just generally elevated.  She checked her sugars on Saturday and Sunday and they were both elevated >600.  Patient does not typically check her sugars.  Patient endorses polyuria, polydipsia, some abdominal discomfort.   In the ER patient's glucose 566, anion gap 15, pH seven 7.41, elevated beta hydroxybutyric acid at 2.99.  She was started on DKA protocol.  Significant Events: Admitted 09/25/2023 new onset DM and hyperosmolar hyperglycemic state   Significant Labs: Admitting serum glucose 566, bicarb 21, BHA 2.99  Significant Imaging Studies:   Antibiotic Therapy: Anti-infectives (From admission, onward)    None       Procedures:   Consultants:    Hospital Course by Problem: * Hyperosmolar hyperglycemic state (HHS) (HCC) Pt admitted for HHS without coma. Placed on IV insulin protocol. Changed over to SQ insulin. Pt seen by inpatient diabetes coordinator. Pt taught how to use insulin pens. Pt placed on novolog and glargine. Freestyle Libre 3 CGM started on pt. Insurance authorization obtained for her CGM. Pt will be discharged to home on SQ  novoplog and lantus.  I have message her PCP at [facility] to see if pt can be seen this week or early next week for hospital f/u for her DM.  DM (diabetes mellitus), type 2, uncontrolled, with hyperosmolarity (HCC) 09-26-2023 A1c 10.1%. will need close outpatient f/u with PCP for DM management with insulin(novolog and lantus) and metformin  Obesity, Class III, BMI 40-49.9 (morbid obesity) (HCC) Weight 124.7 kg. Height 5 feet 8 inches. BMI 41.8.  Appears pt has been obese since childhood. This is a long term problem. Weight loss is advised.    Discharge Diagnoses:  Principal Problem:   Hyperosmolar hyperglycemic state (HHS) (HCC) Active Problems:   PCOS (polycystic ovarian syndrome)   Hyperglycemia   Obesity, Class III, BMI 40-49.9 (morbid obesity) (HCC)   DM (diabetes mellitus), type 2, uncontrolled, with hyperosmolarity [facility])   Discharge Instructions  Discharge Instructions     Ambulatory referral to Nutrition and Diabetic Education   Complete by: As directed    New Diagnosis Diabetes--New Start to Insulin.  A1c pending.  PCP: Romie Jumper, PA   Call MD for:  difficulty breathing, headache or visual disturbances   Complete by: As directed    Call MD for:  extreme fatigue   Complete by: As directed    Call MD for:  hives   Complete by: As directed    Call MD for:  persistant dizziness or light-headedness   Complete by: As directed    Call MD for:  persistant nausea and vomiting   Complete by: As directed    Call MD for:  redness, tenderness, or  signs of infection (pain, swelling, redness, odor or green/yellow discharge around incision site)   Complete by: As directed    Call MD for:  temperature >100.4   Complete by: As directed    Diet - low sodium heart healthy   Complete by: As directed    Discharge instructions   Complete by: As directed    1. Follow up with your primary care provider within 1 week following discharge. Make sure you are writing down your blood sugar  readings and how much insulin you are taking with each shot.   Increase activity slowly   Complete by: As directed       Allergies as of 09/28/2023       Reactions   Diphenhydramine Swelling   Motrin [ibuprofen] Swelling        Medication List     STOP taking these medications    cephALEXin 500 MG capsule Commonly known as: KEFLEX       TAKE these medications    Continuous Glucose Sensor Misc Inject 1 Units into the skin every 14 (fourteen) days. Libre 3 CGM   ferrous sulfate 325 (65 FE) MG EC tablet Take 325 mg by mouth daily with breakfast.   Gvoke HypoPen 2-Pack 1 MG/0.2ML Soaj Generic drug: Glucagon Inject 1 mg into the skin as needed for up to 2 doses (Severe low blood sugar).   insulin aspart cartridge Commonly known as: NOVOLOG CBG 70 - 120: 0 units CBG 121 - 150: 3 units CBG 151 - 200: 4 units CBG 201 - 250: 7 units CBG 251 - 300: 11 units CBG 301 - 350: 15 units CBG 351 - 400: 20 units   SQ TID PRN sliding scale with meals   insulin aspart cartridge Commonly known as: NOVOLOG Inject 15 Units into the skin 3 (three) times daily with meals.   insulin glargine 100 UNIT/ML Solostar Pen Commonly known as: LANTUS Inject 45 Units into the skin every morning.   metFORMIN 500 MG tablet Commonly known as: GLUCOPHAGE Take 1,000 mg by mouth 2 (two) times daily with a meal.   Pen Needles 31G X 5 MM Misc 1 each by Does not apply route 3 (three) times daily. May dispense any manufacturer covered by patient's insurance.        Follow-up Information     Darrianna, Blonder, PA-C Follow up in 2 week(s).   Specialty: Physician Assistant Why: Hospital follow up Contact information: [address] [place] [address] [phone]                Allergies  Allergen Reactions   Diphenhydramine Swelling   Motrin [Ibuprofen] Swelling    Discharge Exam: Vitals:   09/27/23 2212 09/28/23 0648  BP: (!) 151/83 131/65  Pulse: (!)  101 88  Resp: 18 16  Temp: (!) 97.5 F (36.4 C) 98.6 F (37 C)  SpO2: 99% 98%    Physical Exam Vitals and nursing note reviewed.  Constitutional:      General: She is not in acute distress.    Appearance: She is obese. She is not toxic-appearing or diaphoretic.  HENT:     Head: Normocephalic and atraumatic.  Eyes:     General: No scleral icterus. Pulmonary:     Effort: No respiratory distress.  Skin:    General: Skin is warm and dry.     Capillary Refill: Capillary refill takes less than 2 seconds.  Neurological:     General: No focal  deficit present.     Mental Status: She is alert and oriented to person, place, and time.     The results of significant diagnostics from this hospitalization (including imaging, microbiology, ancillary and laboratory) are listed below for reference.    Microbiology: Recent Results (from the past 240 hour(s))  MRSA Next Gen by PCR, Nasal     Status: Abnormal   Collection Time: 09/26/23  4:25 AM   Specimen: Nasal Mucosa; Nasal Swab  Result Value Ref Range Status   MRSA by PCR Next Gen DETECTED (A) NOT DETECTED Final    Comment: RESULT CALLED TO, READ BACK BY AND VERIFIED WITH: Marnee Spring RN AT [phone] ON 09/26/2023 BY MECIAL J. (NOTE) The GeneXpert MRSA Assay (FDA approved for NASAL specimens only), is one component of a comprehensive MRSA colonization surveillance program. It is not intended to diagnose MRSA infection nor to guide or monitor treatment for MRSA infections. Test performance is not FDA approved in patients less than 67 years old. Performed at [facility], [address], [place], [place] [address]      Labs: Basic Metabolic Panel: Recent Labs  Lab 09/25/23 2124 09/26/23 0410 09/27/23 0457 09/28/23 0442  NA 129* 134* 134* 136  K 4.2 3.2* 4.1 3.8  CL 93* 102 100 103  CO2 21* 23 25 24   GLUCOSE 566* 281* 380* 315*  BUN 14 11 15 14   CREATININE 0.94 0.59 0.73 0.63  CALCIUM 9.4 9.0 8.7* 9.1    CBC: Recent Labs  Lab 09/25/23 2124 09/26/23 0410 09/28/23 0442  WBC 7.3 7.2 5.9  NEUTROABS 4.6 3.9  --   HGB 14.3 13.2 12.9  HCT 47.2* 42.7 42.2  MCV 78.1* 77.9* 79.6*  PLT 320 297 254   CBG: Recent Labs  Lab 09/27/23 0937 09/27/23 1222 09/27/23 1650 09/27/23 2210 09/28/23 0725  GLUCAP 472* 279* 322* 442* 289*   Hgb A1c Recent Labs    09/26/23 0410  HGBA1C 10.1*   Urinalysis    Component Value Date/Time   COLORURINE COLORLESS (A) 09/25/2023 2153   APPEARANCEUR CLEAR 09/25/2023 2153   LABSPEC 1.032 (H) 09/25/2023 2153   PHURINE 5.0 09/25/2023 2153   GLUCOSEU >=500 (A) 09/25/2023 2153   HGBUR NEGATIVE 09/25/2023 2153   BILIRUBINUR NEGATIVE 09/25/2023 2153   KETONESUR 20 (A) 09/25/2023 2153   PROTEINUR NEGATIVE 09/25/2023 2153   NITRITE NEGATIVE 09/25/2023 2153   LEUKOCYTESUR NEGATIVE 09/25/2023 2153   Sepsis Labs Recent Labs  Lab 09/25/23 2124 09/26/23 0410 09/28/23 0442  WBC 7.3 7.2 5.9   Microbiology Recent Results (from the past 240 hour(s))  MRSA Next Gen by PCR, Nasal     Status: Abnormal   Collection Time: 09/26/23  4:25 AM   Specimen: Nasal Mucosa; Nasal Swab  Result Value Ref Range Status   MRSA by PCR Next Gen DETECTED (A) NOT DETECTED Final    Comment: RESULT CALLED TO, READ BACK BY AND VERIFIED WITH: Marnee Spring RN AT [phone] ON 09/26/2023 BY MECIAL J. (NOTE) The GeneXpert MRSA Assay (FDA approved for NASAL specimens only), is one component of a comprehensive MRSA colonization surveillance program. It is not intended to diagnose MRSA infection nor to guide or monitor treatment for MRSA infections. Test performance is not FDA approved in patients less than 67 years old. Performed at [facility], [address] [place], [place] [address]     Procedures/Studies: No results found.  Time coordinating discharge: 40 mins  SIGNED:  Carollee Herter, DO [facility] 09/28/23, 11:30 AM

## 2023-09-28 NOTE — Assessment & Plan Note (Signed)
09-26-2023 A1c 10.1%. will need close outpatient f/u with PCP for DM management with insulin(novolog and lantus) and metformin

## 2023-09-28 NOTE — Progress Notes (Signed)
PROGRESS NOTE    Annette Lynch  NGE:[id number] DOB: Jan 21, 1996 DOA: 09/25/2023 PCP: Nathaneil Canary, PA-C  Subjective: Pt seen and examined. Family at bedside Pt wearing CBG. Able to check her BS with her smartphone. Advised she get a paper notebook and write down all her insulin doses and what type of insulin she took with date and  time. Stable for DC today.   Hospital Course: HPI: 27 year old female with history of extreme morbid obesity, PCOS.  Patient maintained on metformin 500 mg daily for PCOS.  Per patient she came in because she is just not been feeling well the past 2 to 3 days.  She has had nausea, vomiting.  She has been sleeping a lot and just generally elevated.  She checked her sugars on Saturday and Sunday and they were both elevated >600.  Patient does not typically check her sugars.  Patient endorses polyuria, polydipsia, some abdominal discomfort.   In the ER patient's glucose 566, anion gap 15, pH seven 7.41, elevated beta hydroxybutyric acid at 2.99.  She was started on DKA protocol.  Significant Events: Admitted 09/25/2023 new onset DM and hyperosmolar hyperglycemic state   Significant Labs: Admitting serum glucose 566, bicarb 21, BHA 2.99  Significant Imaging Studies:   Antibiotic Therapy: Anti-infectives (From admission, onward)    None       Procedures:   Consultants:     Assessment and Plan: * Hyperosmolar hyperglycemic state (HHS) (HCC) Pt admitted for HHS without coma. Placed on IV insulin protocol. Changed over to SQ insulin. Pt seen by inpatient diabetes coordinator. Pt taught how to use insulin pens. Pt placed on novolog and glargine. Freestyle Libre 3 CGM started on pt. Insurance authorization obtained for her CGM. Pt will be discharged to home on SQ novoplog and lantus.  I have message her PCP at [facility] to see if pt can be seen this week or early next week for hospital f/u for her DM.  Obesity, Class III, BMI 40-49.9 (morbid obesity)  (HCC) Weight 124.7 kg. Height 5 feet 8 inches. BMI 41.8.  Appears pt has been obese since childhood. This is a long term problem. Weight loss is advised.   DVT prophylaxis: enoxaparin (LOVENOX) injection 40 mg Start: 09/26/23 1000    Code Status: Full Code Family Communication: discussed with pt and family at bedside Disposition Plan: home Reason for continuing need for hospitalization: medically stable for discharge.  Objective: Vitals:   09/27/23 0426 09/27/23 1219 09/27/23 2212 09/28/23 0648  BP: 127/65 (!) 156/79 (!) 151/83 131/65  Pulse: 92 90 (!) 101 88  Resp: 16 16 18 16   Temp: 98.7 F (37.1 C) 98.6 F (37 C) (!) 97.5 F (36.4 C) 98.6 F (37 C)  TempSrc:  Oral Oral Oral  SpO2: 98% 99% 99% 98%  Weight:        Intake/Output Summary (Last 24 hours) at 09/28/2023 1127 Last data filed at 09/27/2023 1245 Gross per 24 hour  Intake 600 ml  Output --  Net 600 ml   Filed Weights   09/25/23 2124  Weight: 124.7 kg    Examination:  Physical Exam Vitals and nursing note reviewed.  Constitutional:      General: She is not in acute distress.    Appearance: She is obese. She is not toxic-appearing or diaphoretic.  HENT:     Head: Normocephalic and atraumatic.  Eyes:     General: No scleral icterus. Pulmonary:     Effort: No respiratory distress.  Skin:  General: Skin is warm and dry.     Capillary Refill: Capillary refill takes less than 2 seconds.  Neurological:     General: No focal deficit present.     Mental Status: She is alert and oriented to person, place, and time.     Data Reviewed: I have personally reviewed following labs and imaging studies  CBC: Recent Labs  Lab 09/25/23 2124 09/26/23 0410 09/28/23 0442  WBC 7.3 7.2 5.9  NEUTROABS 4.6 3.9  --   HGB 14.3 13.2 12.9  HCT 47.2* 42.7 42.2  MCV 78.1* 77.9* 79.6*  PLT 320 297 254   Basic Metabolic Panel: Recent Labs  Lab 09/25/23 2124 09/26/23 0410 09/27/23 0457 09/28/23 0442  NA 129*  134* 134* 136  K 4.2 3.2* 4.1 3.8  CL 93* 102 100 103  CO2 21* 23 25 24   GLUCOSE 566* 281* 380* 315*  BUN 14 11 15 14   CREATININE 0.94 0.59 0.73 0.63  CALCIUM 9.4 9.0 8.7* 9.1   GFR: CrCl cannot be calculated (Unknown ideal weight.). HbA1C: Recent Labs    09/26/23 0410  HGBA1C 10.1*   CBG: Recent Labs  Lab 09/27/23 0937 09/27/23 1222 09/27/23 1650 09/27/23 2210 09/28/23 0725  GLUCAP 472* 279* 322* 442* 289*    Recent Results (from the past 240 hour(s))  MRSA Next Gen by PCR, Nasal     Status: Abnormal   Collection Time: 09/26/23  4:25 AM   Specimen: Nasal Mucosa; Nasal Swab  Result Value Ref Range Status   MRSA by PCR Next Gen DETECTED (A) NOT DETECTED Final    Comment: RESULT CALLED TO, READ BACK BY AND VERIFIED WITH: Marnee Spring RN AT [phone] ON 09/26/2023 BY MECIAL J. (NOTE) The GeneXpert MRSA Assay (FDA approved for NASAL specimens only), is one component of a comprehensive MRSA colonization surveillance program. It is not intended to diagnose MRSA infection nor to guide or monitor treatment for MRSA infections. Test performance is not FDA approved in patients less than 41 years old. Performed at [facility], [address], [place], [place] [address]      Radiology Studies: No results found.  Scheduled Meds:  Chlorhexidine Gluconate Cloth  6 each Topical Daily   enoxaparin (LOVENOX) injection  40 mg Subcutaneous Q24H   insulin aspart  0-20 Units Subcutaneous TID WC   insulin aspart  0-5 Units Subcutaneous QHS   insulin aspart  15 Units Subcutaneous TID WC   insulin glargine-yfgn  45 Units Subcutaneous Daily   Continuous Infusions:   LOS: 2 days   Time spent: 40 minutes  Carollee Herter, DO  [facility]  09/28/2023, 11:27 AM

## 2023-09-28 NOTE — TOC Transition Note (Signed)
Transition of Care [facility]) - CM/SW Discharge Note   Patient Details  Name: Annette Lynch MRN: [id number] Date of Birth: 1996-09-29  [facility]) CM/SW Contact:  Harriett Sine, RN Phone Number: 09/28/2023, 11:51 AM   Clinical Narrative:    Pt from home, she d/c home with self care. Spoke with pt at bedside about wound and d/c plans. Pt encouraged to schedule a  follow-up appt with pcp. Transportation home arranged by pt.    Final next level of care: Home w Home Health Services Barriers to Discharge: No Barriers Identified   Patient Goals and CMS Choice CMS Medicare.gov Compare Post Acute Care list provided to:: Patient Choice offered to / list presented to : NA  Discharge Placement                         Discharge Plan and Services Additional resources added to the After Visit Summary for     Discharge Planning Services:  (none)            DME Arranged:  (waiting for eval)         HH Arranged:  (waiting for eval)          Social Determinants of Health (SDOH) Interventions SDOH Screenings   Food Insecurity: No Food Insecurity (09/27/2023)  Housing: Patient Declined (09/27/2023)  Transportation Needs: No Transportation Needs (09/27/2023)  Utilities: Not At Risk (09/27/2023)  Financial Resource Strain: Low Risk  (01/03/2023)   Received from [facility]  Physical Activity: Insufficiently Active (11/26/2022)   Received from [facility]  Social Connections: Socially Integrated (11/26/2022)   Received from [facility]  Stress: No Stress Concern Present (11/26/2022)   Received from [facility]  Tobacco Use: Unknown (09/25/2023)     Readmission Risk Interventions     No data to display

## 2023-09-28 NOTE — Plan of Care (Signed)
Pt to D/C home. BP 131/65 (BP Location: Left Arm)   Pulse 88   Temp 98.6 F (37 C) (Oral)   Resp 16   Wt 124.7 kg   SpO2 98%   BMI 41.81 kg/m  Pt IV removed. Belongings returned AVS reviewed and follow up questions answered. No other needs voiced at this time. Annette Hose Johnson10/23/24 12:25 PM

## 2023-09-28 NOTE — Assessment & Plan Note (Addendum)
Weight 124.7 kg. Height 5 feet 8 inches. BMI 41.8.  Appears pt has been obese since childhood. This is a long term problem. Weight loss is advised.

## 2023-09-29 ENCOUNTER — Other Ambulatory Visit: Payer: Self-pay

## 2023-10-05 ENCOUNTER — Other Ambulatory Visit (HOSPITAL_COMMUNITY): Payer: Self-pay

## 2023-12-16 ENCOUNTER — Ambulatory Visit: Payer: Medicaid Other | Admitting: Dietician

## 2023-12-23 ENCOUNTER — Encounter: Payer: Medicaid Other | Attending: Internal Medicine | Admitting: Dietician

## 2023-12-23 DIAGNOSIS — E1100 Type 2 diabetes mellitus with hyperosmolarity without nonketotic hyperglycemic-hyperosmolar coma (NKHHC): Secondary | ICD-10-CM | POA: Diagnosis present

## 2023-12-23 NOTE — Progress Notes (Signed)
Diabetes Self-Management Education  Visit Type: First/Initial  Appt. Start Time: 1122 Appt. End Time: 1230  12/23/2023  [name], identified by name and date of birth, is a 28 y.o. female with a diagnosis of Diabetes: Type 1.   ASSESSMENT    Patient is here today with her significant other. Patient would like to learn more about type one diabetes. Patient lives with her significant other. Pt reports they share shopping and Pt does the cooking.  Pt reprtos constantly mointoring her blood sugars and taking insulin are her biggest challenges.   Pt reports recent changes include increasing water and smaller portions and walking more. Pt reports working PT on her feet most the day and walks her dogs daily. Pt reports taking 45 units of lantus between 7-10 am and states Novolog BID-TID "before" on average "3-4" units. Pt states she desires to learn to count carbohydrates and move forward with an insulin pump. All Pt's questions were answered during this encounter.   History includes:   Past Medical History:  Diagnosis Date   Asthma     Labs noted:   Lab Results  Component Value Date   HGBA1C 10.1 (H) 09/26/2023   No results found for: "CHOL", "HDL", "LDLCALC", "LDLDIRECT", "TRIG", "CHOLHDL"  Last vitamin D No results found for: "25OHVITD2", "25OHVITD3", "VD25OH"  Medications include:   Current Outpatient Medications:    Continuous Glucose Sensor MISC, Place 1 unit into the skin every 14 days, Disp: 3 each, Rfl: 0   ferrous sulfate 325 (65 FE) MG EC tablet, Take 325 mg by mouth daily with breakfast., Disp: , Rfl:    Glucagon (GVOKE HYPOPEN 2-PACK) 1 MG/0.2ML SOAJ, Inject 1 mg into the skin as needed for up to 2 doses (Severe low blood sugar)., Disp: 1 mL, Rfl: 0   insulin aspart (NOVOLOG) 100 UNIT/ML FlexPen, Inject 15 units plus sliding scaled into the skin three times daily  sliding scale: CBG 70 - 120: 0 units CBG 121 - 150: 3 units CBG 151 - 200: 4 units CBG 201 - 250: 7 units  CBG 251 - 300: 11 units CBG 301 - 350: 15 units CBG 351 - 400: 20 units, Disp: 15 mL, Rfl: 0   insulin aspart (NOVOLOG) cartridge, Inject 15 Units into the skin 3 (three) times daily with meals., Disp: 15 mL, Rfl: 0   insulin glargine (LANTUS) 100 UNIT/ML Solostar Pen, Inject 45 Units into the skin every morning., Disp: 15 mL, Rfl: 0   Insulin Pen Needle (PEN NEEDLES) 31G X 5 MM MISC, Use to inject insulin 4 times per day., Disp: 100 each, Rfl: 0   metFORMIN (GLUCOPHAGE) 500 MG tablet, Take 1,000 mg by mouth 2 (two) times daily with a meal., Disp: , Rfl:    CGM Results from download:  Dexcom g7  Average glucose:   155 mg/dL for 14 days  Time in range (70-180 mg/dL):   42% %   (Goal >70%)  Time High (181-250 mg/dL):   21 %   (Goal < 62%)  Time Very High (>250 mg/dL):    3 %   (Goal < 5%)  Time Low (54-69 mg/dL):   0 %   (Goal <3%)  Time Very Low (<54 mg/dL):   0 %   (Goal <7%)   There were no vitals taken for this visit. There is no height or weight on file to calculate BMI.   Diabetes Self-Management Education - 12/23/23 1125       Visit Information  Visit Type First/Initial      Initial Visit   Diabetes Type Type 1    Date Diagnosed 2024    Are you currently following a meal plan? No    Are you taking your medications as prescribed? Yes      Health Coping   How would you rate your overall health? Fair      Psychosocial Assessment   Patient Belief/Attitude about Diabetes Afraid    What is the hardest part about your diabetes right now, causing you the most concern, or is the most worrisome to you about your diabetes?   Taking/obtaining medications;Checking blood sugar    Self-care barriers None    Self-management support Doctor's office    Other persons present Patient;Spouse/SO    Patient Concerns Monitoring;Glycemic Control;Medication    Special Needs None    Preferred Learning Style No preference indicated    Learning Readiness Not Ready    How often do you need to have  someone help you when you read instructions, pamphlets, or other written materials from your doctor or pharmacy? 1 - Never    What is the last grade level you completed in school? 12th      Pre-Education Assessment   Patient understands the diabetes disease and treatment process. Needs Instruction    Patient understands incorporating nutritional management into lifestyle. Needs Instruction    Patient undertands incorporating physical activity into lifestyle. Needs Instruction    Patient understands using medications safely. Needs Instruction    Patient understands monitoring blood glucose, interpreting and using results Needs Instruction    Patient understands prevention, detection, and treatment of acute complications. Needs Instruction    Patient understands prevention, detection, and treatment of chronic complications. Needs Instruction    Patient understands how to develop strategies to address psychosocial issues. Needs Instruction    Patient understands how to develop strategies to promote health/change behavior. Needs Instruction      Complications   Last HgB A1C per patient/outside source 10.1 %    How often do you check your blood sugar? > 4 times/day    Number of hypoglycemic episodes per month 1    Can you tell when your blood sugar is low? Yes    What do you do if your blood sugar is low? shaky, confused    Number of hyperglycemic episodes ( >200mg /dL): Occasional    Can you tell when your blood sugar is high? Yes    What do you do if your blood sugar is high? headache    Have you had a dilated eye exam in the past 12 months? No    Have you had a dental exam in the past 12 months? Yes    Are you checking your feet? Yes    How many days per week are you checking your feet? 3      Dietary Intake   Breakfast eggs, 1 packet oatmeal or skips    Lunch skip or sun chips, [place] sandwich wheat bread    Dinner beef potaotes, carrots stew, fried cabbage    Beverage(s) water, zero  sugar sprite, crystal light      Activity / Exercise   Activity / Exercise Type Light (walking / raking leaves)    How many days per week do you exercise? 7    How many minutes per day do you exercise? 30    Total minutes per week of exercise 210      Patient Education   Previous Diabetes  Education Yes (please comment)    Disease Pathophysiology Definition of diabetes, type 1 and 2, and the diagnosis of diabetes;Explored patient's options for treatment of their diabetes    Healthy Eating Food label reading, portion sizes and measuring food.;Carbohydrate counting;Plate Method;Role of diet in the treatment of diabetes and the relationship between the three main macronutrients and blood glucose level;Meal timing in regards to the patients' current diabetes medication.    Being Active Role of exercise on diabetes management, blood pressure control and cardiac health.    Medications Reviewed medication adjustment guidelines for hyperglycemia and sick days.    Monitoring Identified appropriate SMBG and/or A1C goals.;Taught/evaluated CGM (comment)    Acute complications Educated on sick day management;Taught prevention, symptoms, and  treatment of hypoglycemia - the 15 rule.;Trained/discussed glucagon administration to patient and designated other.    Chronic complications Dental care;Lipid levels, blood glucose control and heart disease;Assessed and discussed foot care and prevention of foot problems;Identified and discussed with patient  current chronic complications    Diabetes Stress and Support Identified and addressed patients feelings and concerns about diabetes    Preconception care Role of family planning for patients with diabetes    Lifestyle and Health Coping Lifestyle issues that need to be addressed for better diabetes care      Individualized Goals (developed by patient)   Nutrition Carb counting    Physical Activity Exercise 1-2 times per week    Medications take my medication as  prescribed    Monitoring  Consistenly use CGM    Problem Solving Eating Pattern    Reducing Risk check ketones if blood glucose over 240mg /dL;do foot checks daily;treat hypoglycemia with 15 grams of carbs if blood glucose less than 70mg /dL    Health Coping Ask for help with psychological, social, or emotional issues      Post-Education Assessment   Patient understands the diabetes disease and treatment process. Needs Review    Patient understands incorporating nutritional management into lifestyle. Needs Review    Patient undertands incorporating physical activity into lifestyle. Needs Review    Patient understands using medications safely. Needs Review    Patient understands monitoring blood glucose, interpreting and using results Needs Review    Patient understands prevention, detection, and treatment of acute complications. Needs Review    Patient understands prevention, detection, and treatment of chronic complications. Needs Review    Patient understands how to develop strategies to address psychosocial issues. Needs Review    Patient understands how to develop strategies to promote health/change behavior. Needs Review      Outcomes   Expected Outcomes Demonstrated interest in learning. Expect positive outcomes    Future DMSE 3-4 months    Program Status Not Completed             Individualized Plan for Diabetes Self-Management Training:   Learning Objective:  Patient will have a greater understanding of diabetes self-management. Patient education plan is to attend individual and/or group sessions per assessed needs and concerns.   Plan:   There are no Patient Instructions on file for this visit.  Expected Outcomes:  Demonstrated interest in learning. Expect positive outcomes  Education material provided: ADA - How to Thrive: A Guide for Your Journey with Diabetes, My Plate, Snack sheet, and Carbohydrate counting sheet  If problems or questions, patient to contact team  via:  Phone  Future DSME appointment: 3-4 months

## 2023-12-23 NOTE — Patient Instructions (Signed)
1 start counting carbs at meals and snacks
